# Patient Record
Sex: Female | Born: 1960 | Race: Black or African American | Hispanic: No | Marital: Single | Smoking: Former smoker
Health system: Southern US, Community
[De-identification: ages and names within clinical notes are randomized; demographics above are authoritative.]

## PROBLEM LIST (undated history)

## (undated) DIAGNOSIS — K219 Gastro-esophageal reflux disease without esophagitis: Secondary | ICD-10-CM

## (undated) DIAGNOSIS — N390 Urinary tract infection, site not specified: Secondary | ICD-10-CM

## (undated) DIAGNOSIS — I10 Essential (primary) hypertension: Secondary | ICD-10-CM

## (undated) DIAGNOSIS — T7840XA Allergy, unspecified, initial encounter: Secondary | ICD-10-CM

## (undated) DIAGNOSIS — G43909 Migraine, unspecified, not intractable, without status migrainosus: Secondary | ICD-10-CM

## (undated) DIAGNOSIS — IMO0002 Reserved for concepts with insufficient information to code with codable children: Secondary | ICD-10-CM

## (undated) DIAGNOSIS — R011 Cardiac murmur, unspecified: Secondary | ICD-10-CM

## (undated) DIAGNOSIS — Z5189 Encounter for other specified aftercare: Secondary | ICD-10-CM

## (undated) HISTORY — DX: Essential (primary) hypertension: I10

## (undated) HISTORY — DX: Encounter for other specified aftercare: Z51.89

## (undated) HISTORY — DX: Gastro-esophageal reflux disease without esophagitis: K21.9

## (undated) HISTORY — DX: Cardiac murmur, unspecified: R01.1

## (undated) HISTORY — DX: Migraine, unspecified, not intractable, without status migrainosus: G43.909

## (undated) HISTORY — PX: TUBAL LIGATION: SHX77

## (undated) HISTORY — DX: Urinary tract infection, site not specified: N39.0

## (undated) HISTORY — DX: Allergy, unspecified, initial encounter: T78.40XA

## (undated) HISTORY — DX: Reserved for concepts with insufficient information to code with codable children: IMO0002

---

## 1999-05-21 HISTORY — PX: SHOULDER ARTHROSCOPY: SHX128

## 2010-05-20 HISTORY — PX: UTERINE FIBROID SURGERY: SHX826

## 2015-12-06 DIAGNOSIS — J3089 Other allergic rhinitis: Secondary | ICD-10-CM | POA: Diagnosis not present

## 2015-12-06 DIAGNOSIS — M545 Low back pain: Secondary | ICD-10-CM | POA: Diagnosis not present

## 2016-06-26 DIAGNOSIS — J00 Acute nasopharyngitis [common cold]: Secondary | ICD-10-CM | POA: Diagnosis not present

## 2016-06-27 ENCOUNTER — Encounter: Payer: Self-pay | Admitting: Family Medicine

## 2016-06-27 ENCOUNTER — Ambulatory Visit (INDEPENDENT_AMBULATORY_CARE_PROVIDER_SITE_OTHER): Payer: BLUE CROSS/BLUE SHIELD | Admitting: Family Medicine

## 2016-06-27 VITALS — BP 146/94 | HR 70

## 2016-06-27 DIAGNOSIS — J324 Chronic pansinusitis: Secondary | ICD-10-CM | POA: Diagnosis not present

## 2016-06-27 DIAGNOSIS — Z7689 Persons encountering health services in other specified circumstances: Secondary | ICD-10-CM

## 2016-06-27 DIAGNOSIS — R030 Elevated blood-pressure reading, without diagnosis of hypertension: Secondary | ICD-10-CM | POA: Diagnosis not present

## 2016-06-27 DIAGNOSIS — Z91018 Allergy to other foods: Secondary | ICD-10-CM | POA: Insufficient documentation

## 2016-06-27 MED ORDER — DOXYCYCLINE HYCLATE 100 MG PO TABS: 100.0000 mg | ORAL_TABLET | Freq: Two times a day (BID) | ORAL | 0 refills | Status: DC

## 2016-06-27 NOTE — Progress Notes (Signed)
Patient ID: Tracey Terrell, female   DOB: 10-Mar-1961, 56 y.o.   MRN: [id number]  Patient presents to clinic today to establish care.  Acute Concerns:  Symptoms of sinus pressure/pain, congestion that started one week ago.  Associated rhinitis with yellow drainage, productive cough with yellow sputum, vomiting x 2 which has improved, ear pain, nasal congestion, and chills, sweats. She reports congestion and sinus pressure/pain have been present for 6 to 8 weeks and have improved then worsened and are present again for the past week. Other symptoms have improved. She denies sore throat, fever, SOB, and tooth pain. No history of asthma/bronchitis. She is a former smoker; quit 10 years ago. No recent sick contact exposure. No recent antibiotic therapy. Treatment at home includes pseudophedrine and benedryl have provided limited benefit. History of allergic rhinitis.   Health Maintenance: Dental --Every 6 months Vision --Every year Immunizations --No influenza vaccine.  Td in 2014 Colonoscopy -- 2014; Return in 10 years. Mammogram -- Due for one. Due in 2017 PAP -- Last pap done 2017; yearly follow up; Pap normal per patient Bone Density -- Screening; 2017    Allergies  Allergen Reactions  . Naproxen Anaphylaxis    No family history on file.  Social History   Social History  . Marital status: Single    Spouse name: N/A  . Number of children: N/A  . Years of education: N/A   Occupational History  . Not on file.   Social History Main Topics  . Smoking status: Not on file  . Smokeless tobacco: Not on file  . Alcohol use Not on file  . Drug use: Unknown  . Sexual activity: Not on file   Other Topics Concern  . Not on file   Social History Narrative  . No narrative on file    Review of Systems  Constitutional: Positive for chills.  HENT: Positive for congestion, ear pain and sinus pain. Negative for sore throat.   Respiratory: Positive for cough and wheezing.  Negative for sputum production.   Cardiovascular: Negative for chest pain.  Gastrointestinal: Positive for nausea and vomiting.  Musculoskeletal: Negative for myalgias.  Neurological: Negative for dizziness and headaches.   Past Medical History:  Diagnosis Date  . Allergy   . Blood transfusion without reported diagnosis   . GERD (gastroesophageal reflux disease)   . Heart murmur   . Migraines   . Ulcer (HCC)   . UTI (urinary tract infection)      Social History   Social History  . Marital status: Single    Spouse name: N/A  . Number of children: N/A  . Years of education: N/A   Occupational History  . Not on file.   Social History Main Topics  . Smoking status: Never Smoker  . Smokeless tobacco: Never Used  . Alcohol use 0.6 oz/week    1 Glasses of wine per week  . Drug use: No  . Sexual activity: Yes     Comment: TUBAL  LIGATION   Other Topics Concern  . Not on file   Social History Narrative  . No narrative on file    Past Surgical History:  Procedure Laterality Date  . CESAREAN SECTION      Family History  Problem Relation Age of Onset  . Arthritis Mother   . Cancer Mother   . Heart disease Mother   . Hypertension Mother   . Diabetes Mother   . Arthritis Father   . Cancer Father   .  Heart disease Father   . Hypertension Father   . Diabetes Father   . Hypertension Sister   . Hypertension Brother   . Diabetes Maternal Grandmother   . Hypertension Maternal Grandfather   . Arthritis Maternal Grandfather     Allergies  Allergen Reactions  . Naproxen Anaphylaxis    No current outpatient prescriptions on file prior to visit.   No current facility-administered medications on file prior to visit.     BP (!) 146/94   Pulse 70       Physical Exam  Constitutional: She is oriented to person, place, and time and well-developed, well-nourished, and in no distress.  HENT:  Right Ear: Tympanic membrane normal.  Left Ear: Tympanic membrane  normal.  Nose: Rhinorrhea present. Right sinus exhibits maxillary sinus tenderness and frontal sinus tenderness. Left sinus exhibits maxillary sinus tenderness and frontal sinus tenderness.  Mouth/Throat: Mucous membranes are normal. No oropharyngeal exudate or posterior oropharyngeal erythema.  Eyes: Pupils are equal, round, and reactive to light. No scleral icterus.  Neck: Neck supple.  Cardiovascular: Normal rate and regular rhythm.   Pulmonary/Chest: Effort normal and breath sounds normal. She has no wheezes.  Abdominal: Soft. Bowel sounds are normal. There is no tenderness.  Neurological: She is alert and oriented to person, place, and time.  Skin: Skin is warm and dry. No rash noted.  Psychiatric: Mood, memory, affect and judgment normal.    Assessment/Plan:  1. Elevated blood pressure reading without diagnosis of hypertension Retake of 146/94. No history of HTN per patient. Suspect that BP was elevated due to pseudoephedrine use on a chronic basis. Advised stopping pseudoephedrine and use either Allegra, claritin, or Zyrtec for symptoms of post nasal drip and allergic rhinitis. Advised monitoring BP for 2 weeks, document readings, and follow up for further evaluation. BP recommendations provided.  2. Pansinusitis, unspecified chronicity Symptom duration of greater than one week; double sickening presentation; will treat with doxycycline; Advised patient on supportive measures: Suspect nausea and prior episodes of vomiting that have resolved were viral in nature. Persistent symptoms of sinus pressure/pain remain. Get rest, drink plenty of fluids, and use tylenol as needed for pain. Follow up if fever >101, if symptoms worsen or if symptoms are not improved in 3 to 4 days. Patient verbalizes understanding.    - doxycycline (VIBRA-TABS) 100 MG tablet; Take 1 tablet (100 mg total) by mouth 2 (two) times daily.  Dispense: 20 tablet; Refill: 0  3. Encounter to establish care We reviewed the  PMH, PSH, FH, SH, Meds and Allergies. -We provided refills for any medications we will prescribe as needed. -We addressed current concerns per orders and patient instructions. -We have asked for records for pertinent exams, studies, vaccines and notes from previous providers. -We have advised patient to follow up per instructions below.   -Patient advised to return or notify a provider immediately if symptoms worsen or persist or new concerns arise.  Advised follow up in 2 weeks for BP evaluation, physical, and lab work.  Roddie Mc, FNP-C

## 2016-06-27 NOTE — Patient Instructions (Addendum)
It was pleasure meeting you today! Please take antibiotic as directed and follow up if symptoms do not improve in 3 to 4 days, worsen, or you develop a fever >100.  Please discontinue use of pseudoephedrine and use either Allegra, Claritin, or Zyrtec as needed for symptoms.  Minimal Blood Pressure Goal= AVERAGE < 140/90; Ideal is an AVERAGE < 135/85. This AVERAGE should be calculated from @ least 5-7 BP readings taken @ different times of day on different days of week. You should not respond to isolated BP readings , but rather the AVERAGE for that week .Please bring your blood pressure cuff to office visits to verify that it is reliable.It can also be checked against the blood pressure device at the pharmacy. Finger or wrist cuffs are not dependable; an arm cuff is.  Follow up in 2 weeks for further evaluation of your BP and schedule a physical and lab work at your convenience.    Sinus Headache A sinus headache occurs when the paranasal sinuses become clogged or swollen. Paranasal sinuses are air pockets within the bones of the face. Sinus headaches can range from mild to severe. What are the causes? A sinus headache can result from various conditions that affect the sinuses, such as:  Colds.  Sinus infections.  Allergies. What are the signs or symptoms? The main symptom of this condition is a headache that may feel like pain or pressure in the face, forehead, ears, or upper teeth. People who have a sinus headache often have other symptoms, such as:  Congested or runny nose.  Fever.  Inability to smell. Weather changes can make symptoms worse. How is this diagnosed? This condition may be diagnosed based on:  A physical exam and medical history.  Imaging tests, such as a CT scan and MRI, to check for problems with the sinuses.  A specialist may look into the sinuses with a tool that has a camera (endoscopy). How is this treated? Treatment for this condition depends on the  cause.  Sinus pain that is caused by a sinus infection may be treated with antibiotic medicine.  Sinus pain that is caused by allergies may be helped by allergy medicines (antihistamines) and medicated nasal sprays.  Sinus pain that is caused by congestion may be helped by flushing the nose and sinuses with saline solution. Follow these instructions at home:  Take medicines only as directed by your health care provider.  If you were prescribed an antibiotic medicine, finish all of it even if you start to feel better.  If you have congestion, use a nasal spray to help reduce pressure.  If directed, apply a warm, moist washcloth to your face to help relieve pain. Contact a health care provider if:  You have headaches more than one time each week.  You have sensitivity to light or sound.  You have a fever.  You feel sick to your stomach (nauseous) or you throw up (vomit).  Your headaches do not get better with treatment. Many people think that they have a sinus headache when they actually have migraines or tension headaches. Get help right away if:  You have vision problems.  You have sudden, severe pain in your face or head.  You have a seizure.  You are confused.  You have a stiff neck. This information is not intended to replace advice given to you by your health care provider. Make sure you discuss any questions you have with your health care provider. Document Released: 06/13/2004 Document Revised:  12/31/2015 Document Reviewed: 05/02/2014 Elsevier Interactive Patient Education  2017 [employer].

## 2016-06-27 NOTE — Progress Notes (Signed)
Pre visit review using our clinic review tool, if applicable. No additional management support is needed unless otherwise documented below in the visit note. 

## 2016-06-28 ENCOUNTER — Telehealth: Payer: Self-pay | Admitting: Family Medicine

## 2016-06-28 ENCOUNTER — Other Ambulatory Visit: Payer: Self-pay

## 2016-06-28 DIAGNOSIS — J324 Chronic pansinusitis: Secondary | ICD-10-CM

## 2016-06-28 MED ORDER — DOXYCYCLINE HYCLATE 100 MG PO TABS: 100.0000 mg | ORAL_TABLET | Freq: Two times a day (BID) | ORAL | 0 refills | Status: AC

## 2016-06-28 NOTE — Telephone Encounter (Signed)
Pt calling stating that the Rx was not at the pharmacy can it be resent to [facility] [address].

## 2016-06-28 NOTE — Telephone Encounter (Signed)
Rx was resend to [facility], patient aware.

## 2016-07-11 ENCOUNTER — Encounter: Payer: Self-pay | Admitting: Family Medicine

## 2016-07-11 ENCOUNTER — Ambulatory Visit (INDEPENDENT_AMBULATORY_CARE_PROVIDER_SITE_OTHER): Payer: BLUE CROSS/BLUE SHIELD | Admitting: Family Medicine

## 2016-07-11 VITALS — BP 148/98 | HR 72 | Temp 98.2°F | Wt 156.0 lb

## 2016-07-11 DIAGNOSIS — I10 Essential (primary) hypertension: Secondary | ICD-10-CM | POA: Diagnosis not present

## 2016-07-11 LAB — BASIC METABOLIC PANEL
BUN: 13 mg/dL (ref 6–23)
CHLORIDE: 106 meq/L (ref 96–112)
CO2: 31 mEq/L (ref 19–32)
CREATININE: 0.78 mg/dL (ref 0.40–1.20)
Calcium: 9.1 mg/dL (ref 8.4–10.5)
GFR: 98.72 mL/min (ref >60.00)
GLUCOSE: 91 mg/dL (ref 70–99)
Potassium: 4 mEq/L (ref 3.5–5.1)
Sodium: 141 mEq/L (ref 135–145)

## 2016-07-11 LAB — HEPATIC FUNCTION PANEL
ALBUMIN: 4.2 g/dL (ref 3.5–5.2)
ALT: 13 U/L (ref 0–35)
AST: 17 U/L (ref 0–37)
Alkaline Phosphatase: 91 U/L (ref 39–117)
BILIRUBIN TOTAL: 0.3 mg/dL (ref 0.2–1.2)
Bilirubin, Direct: 0.1 mg/dL (ref 0.0–0.3)
TOTAL PROTEIN: 6.8 g/dL (ref 6.0–8.3)

## 2016-07-11 LAB — CBC WITH DIFFERENTIAL/PLATELET
Basophils Absolute: 0 10*3/uL (ref 0.0–0.1)
Basophils Relative: 0.8 % (ref 0.0–3.0)
EOS ABS: 0.1 10*3/uL (ref 0.0–0.7)
EOS PCT: 2.3 % (ref 0.0–5.0)
HEMATOCRIT: 36.6 % (ref 36.0–46.0)
Hemoglobin: 12 g/dL (ref 12.0–15.0)
Lymphocytes Relative: 40.7 % (ref 12.0–46.0)
Lymphs Abs: 1.5 10*3/uL (ref 0.7–4.0)
MCHC: 32.9 g/dL (ref 30.0–36.0)
MCV: 87.2 fl (ref 78.0–100.0)
MONO ABS: 0.3 10*3/uL (ref 0.1–1.0)
Monocytes Relative: 6.9 % (ref 3.0–12.0)
Neutro Abs: 1.9 10*3/uL (ref 1.4–7.7)
Neutrophils Relative %: 49.3 % (ref 43.0–77.0)
Platelets: 227 10*3/uL (ref 150.0–400.0)
RBC: 4.2 Mil/uL (ref 3.87–5.11)
RDW: 13.9 % (ref 11.5–15.5)
WBC: 3.8 10*3/uL — AB (ref 4.0–10.5)

## 2016-07-11 MED ORDER — AMLODIPINE BESYLATE 5 MG PO TABS: 5.0000 mg | ORAL_TABLET | Freq: Every day | ORAL | 1 refills | Status: DC

## 2016-07-11 NOTE — Patient Instructions (Signed)
Please take medication as directed and continue monitoring blood pressure. Please bring readings with you to your next visit with your cuff so we can review blood pressure averages and effectiveness of medication. Follow up in 2 weeks for blood pressure evaluation or sooner if needed. Also, please feel free to call with any questions or concerns that you have as you start this new medication. Great work with dietary changes! Please continue to monitor your salt intake. See recommendations below.  DASH Eating Plan DASH stands for "Dietary Approaches to Stop Hypertension." The DASH eating plan is a healthy eating plan that has been shown to reduce high blood pressure (hypertension). Additional health benefits may include reducing the risk of type 2 diabetes mellitus, heart disease, and stroke. The DASH eating plan may also help with weight loss. What do I need to know about the DASH eating plan? For the DASH eating plan, you will follow these general guidelines:  Choose foods with less than 150 milligrams of sodium per serving (as listed on the food label).  Use salt-free seasonings or herbs instead of table salt or sea salt.  Check with your health care provider or pharmacist before using salt substitutes.  Eat lower-sodium products. These are often labeled as "low-sodium" or "no salt added."  Eat fresh foods. Avoid eating a lot of canned foods.  Eat more vegetables, fruits, and low-fat dairy products.  Choose whole grains. Look for the word "whole" as the first word in the ingredient list.  Choose fish and skinless chicken or [place] more often than red meat. Limit fish, poultry, and meat to 6 oz (170 g) each day.  Limit sweets, desserts, sugars, and sugary drinks.  Choose heart-healthy fats.  Eat more home-cooked food and less restaurant, buffet, and fast food.  Limit fried foods.  Do not fry foods. Cook foods using methods such as baking, boiling, grilling, and broiling  instead.  When eating at a restaurant, ask that your food be prepared with less salt, or no salt if possible. What foods can I eat? Seek help from a dietitian for individual calorie needs. Grains  Whole grain or whole wheat bread. Brown rice. Whole grain or whole wheat pasta. Quinoa, bulgur, and whole grain cereals. Low-sodium cereals. Corn or whole wheat flour tortillas. Whole grain cornbread. Whole grain crackers. Low-sodium crackers. Vegetables  Fresh or frozen vegetables (raw, steamed, roasted, or grilled). Low-sodium or reduced-sodium tomato and vegetable juices. Low-sodium or reduced-sodium tomato sauce and paste. Low-sodium or reduced-sodium canned vegetables. Fruits  All fresh, canned (in natural juice), or frozen fruits. Meat and Other Protein Products  Ground beef (85% or leaner), grass-fed beef, or beef trimmed of fat. Skinless chicken or [place]. Ground chicken or [place]key. Pork trimmed of fat. All fish and seafood. Eggs. Dried beans, peas, or lentils. Unsalted nuts and seeds. Unsalted canned beans. Dairy  Low-fat dairy products, such as skim or 1% milk, 2% or reduced-fat cheeses, low-fat ricotta or cottage cheese, or plain low-fat yogurt. Low-sodium or reduced-sodium cheeses. Fats and Oils  Tub margarines without trans fats. Light or reduced-fat mayonnaise and salad dressings (reduced sodium). Avocado. Safflower, olive, or canola oils. Natural peanut or almond butter. Other  Unsalted popcorn and pretzels. The items listed above may not be a complete list of recommended foods or beverages. Contact your dietitian for more options.  What foods are not recommended? Grains  White bread. White pasta. White rice. Refined cornbread. Bagels and croissants. Crackers that contain trans fat. Vegetables  Creamed or fried  vegetables. Vegetables in a cheese sauce. Regular canned vegetables. Regular canned tomato sauce and paste. Regular tomato and vegetable juices. Fruits  Canned fruit in light  or heavy syrup. Fruit juice. Meat and Other Protein Products  Fatty cuts of meat. Ribs, chicken wings, bacon, sausage, bologna, salami, chitterlings, fatback, hot dogs, bratwurst, and packaged luncheon meats. Salted nuts and seeds. Canned beans with salt. Dairy  Whole or 2% milk, cream, half-and-half, and cream cheese. Whole-fat or sweetened yogurt. Full-fat cheeses or blue cheese. Nondairy creamers and whipped toppings. Processed cheese, cheese spreads, or cheese curds. Condiments  Onion and garlic salt, seasoned salt, table salt, and sea salt. Canned and packaged gravies. Worcestershire sauce. Tartar sauce. Barbecue sauce. Teriyaki sauce. Soy sauce, including reduced sodium. Steak sauce. Fish sauce. Oyster sauce. Cocktail sauce. Horseradish. Ketchup and mustard. Meat flavorings and tenderizers. Bouillon cubes. Hot sauce. Tabasco sauce. Marinades. Taco seasonings. Relishes. Fats and Oils  Butter, stick margarine, lard, shortening, ghee, and bacon fat. Coconut, palm kernel, or palm oils. Regular salad dressings. Other  Pickles and olives. Salted popcorn and pretzels. The items listed above may not be a complete list of foods and beverages to avoid. Contact your dietitian for more information.  Where can I find more information? National Heart, Lung, and Blood Institute: [employer] This information is not intended to replace advice given to you by your health care provider. Make sure you discuss any questions you have with your health care provider. Document Released: 04/25/2011 Document Revised: 10/12/2015 Document Reviewed: 03/10/2013 Elsevier Interactive Patient Education  2017 [employer].

## 2016-07-11 NOTE — Progress Notes (Signed)
Subjective:    Patient ID: Tracey Terrell, female    DOB: 06-18-1960, 56 y.o.   MRN: [id number]  HPI  Tracey Terrell is a 56 year old female who presents today for follow up for evaluation of her blood pressure after an elevated reading noted on 06/27/16. At the 06/27/16 visit she was experiencing congestion and was taking pseudoephedrine which may have led to an increase in her BP.  She is monitoring her BP at least 6 days/week and usually in the morning.  She reports average BP is noted as systolic in the 140s and the 90s diastolic. She denies chest pain, palpitations, SOB, edema, headaches, numbness, tingling, or weakness, nosebleeds, and edema. She reports decreasing her salt intake and increasing her her lean meats and vegetables for 2 to 3 days as she has experienced weight gain due to eating fast food. She does not exercise but reports that she plans to start walking due to weather improving.  No prior treatment for HTN. This is her second visit to the practice as she is new, established care and has been advised to follow up for full physical and lab work.    Review of Systems  Constitutional: Negative for chills, fatigue and fever.  HENT: Positive for congestion. Negative for rhinorrhea, sinus pain, sinus pressure and sneezing.   Eyes: Negative for visual disturbance.  Respiratory: Negative for cough, shortness of breath and wheezing.   Cardiovascular: Negative for chest pain, palpitations and leg swelling.  Gastrointestinal: Negative for abdominal pain, diarrhea, nausea and vomiting.  Musculoskeletal: Negative for myalgias.  Neurological: Negative for dizziness, weakness, light-headedness, numbness and headaches.  Psychiatric/Behavioral:       Denies depressed or anxious mood   Past Medical History:  Diagnosis Date  . Allergy   . Blood transfusion without reported diagnosis   . GERD (gastroesophageal reflux disease)   . Heart murmur   . Migraines   . Ulcer (HCC)   . UTI  (urinary tract infection)      Social History   Social History  . Marital status: Single    Spouse name: N/A  . Number of children: N/A  . Years of education: N/A   Occupational History  . Not on file.   Social History Main Topics  . Smoking status: Never Smoker  . Smokeless tobacco: Never Used  . Alcohol use 0.6 oz/week    1 Glasses of wine per week  . Drug use: No  . Sexual activity: Yes     Comment: TUBAL  LIGATION   Other Topics Concern  . Not on file   Social History Narrative  . No narrative on file    Past Surgical History:  Procedure Laterality Date  . CESAREAN SECTION      Family History  Problem Relation Age of Onset  . Arthritis Mother   . Cancer Mother   . Heart disease Mother   . Hypertension Mother   . Diabetes Mother   . Arthritis Father   . Cancer Father   . Heart disease Father   . Hypertension Father   . Diabetes Father   . Hypertension Sister   . Hypertension Brother   . Diabetes Maternal Grandmother   . Hypertension Maternal Grandfather   . Arthritis Maternal Grandfather     Allergies  Allergen Reactions  . Naproxen Anaphylaxis    Current Outpatient Prescriptions on File Prior to Visit  Medication Sig Dispense Refill  . ibuprofen (ADVIL,MOTRIN) 200 MG tablet Take 200 mg by  mouth every 6 (six) hours as needed for mild pain (S NEEDED FOR MILD PAIN).      No current facility-administered medications on file prior to visit.     BP (!) 148/98   Pulse 72   Temp 98.2 F (36.8 C) (Oral)   Wt 156 lb (70.8 kg)   SpO2 98%       Objective:   Physical Exam  Constitutional: She is oriented to person, place, and time. She appears well-developed and well-nourished.  HENT:  Nose: No rhinorrhea. Right sinus exhibits no maxillary sinus tenderness and no frontal sinus tenderness. Left sinus exhibits no maxillary sinus tenderness and no frontal sinus tenderness.  Mouth/Throat: Mucous membranes are normal. No oropharyngeal exudate or  posterior oropharyngeal erythema.  Eyes: Pupils are equal, round, and reactive to light. No scleral icterus.  Neck: Neck supple.  Cardiovascular: Normal rate, regular rhythm and intact distal pulses.   Pulmonary/Chest: Effort normal and breath sounds normal. She has no wheezes. She has no rales.  Abdominal: Soft. Bowel sounds are normal. There is no tenderness.  Lymphadenopathy:    She has no cervical adenopathy.  Neurological: She is alert and oriented to person, place, and time.  Skin: Skin is warm and dry. No rash noted.  Psychiatric: She has a normal mood and affect. Her behavior is normal. Judgment and thought content normal.       Assessment & Plan:  1. Essential hypertension BP is elevated even after stopping nasal decongestant. Home readings and in office readings indicate elevation. She does not have any lab work on record. Will obtain basic labs today and initiate treatment with amlodipine with recommendations for continued BP monitoring and follow up in 2 weeks or sooner if needed. Recommend scheduling a physical with lab work within the next 2 months. - CBC with Differential/Platelet - Basic metabolic panel - Hepatic function panel - amLODipine (NORVASC) 5 MG tablet; Take 1 tablet (5 mg total) by mouth daily.  Dispense: 30 tablet; Refill: 1  Roddie McJulia Mickie Badders, FNP-C

## 2016-07-11 NOTE — Progress Notes (Signed)
Pre visit review using our clinic review tool, if applicable. No additional management support is needed unless otherwise documented below in the visit note. 

## 2016-07-15 DIAGNOSIS — Z01419 Encounter for gynecological examination (general) (routine) without abnormal findings: Secondary | ICD-10-CM | POA: Diagnosis not present

## 2016-07-15 DIAGNOSIS — Z6827 Body mass index (BMI) 27.0-27.9, adult: Secondary | ICD-10-CM | POA: Diagnosis not present

## 2016-11-02 DIAGNOSIS — J029 Acute pharyngitis, unspecified: Secondary | ICD-10-CM | POA: Diagnosis not present

## 2016-11-02 DIAGNOSIS — J019 Acute sinusitis, unspecified: Secondary | ICD-10-CM | POA: Diagnosis not present

## 2016-11-07 ENCOUNTER — Ambulatory Visit (INDEPENDENT_AMBULATORY_CARE_PROVIDER_SITE_OTHER): Payer: BLUE CROSS/BLUE SHIELD | Admitting: Family Medicine

## 2016-11-07 ENCOUNTER — Encounter: Payer: Self-pay | Admitting: Family Medicine

## 2016-11-07 VITALS — BP 138/92 | HR 86 | Temp 99.1°F | Wt 154.0 lb

## 2016-11-07 DIAGNOSIS — J0140 Acute pansinusitis, unspecified: Secondary | ICD-10-CM

## 2016-11-07 DIAGNOSIS — I10 Essential (primary) hypertension: Secondary | ICD-10-CM | POA: Insufficient documentation

## 2016-11-07 MED ORDER — AMOXICILLIN-POT CLAVULANATE 875-125 MG PO TABS: 1.0000 | ORAL_TABLET | Freq: Two times a day (BID) | ORAL | 0 refills | Status: DC

## 2016-11-07 NOTE — Progress Notes (Signed)
Patient ID: Tracey Terrell, female   DOB: 06/13/1960, 56 y.o.   MRN: [id number]  PCP: Roddie McKordsmeier, Genene Kilman, FNP  Subjective:  Tracey Terrell is a 56 y.o. year old very pleasant female patient who presents with Upper Respiratory infection symptoms including  nasal congestion, sore throat, sinus pressure/pain, chills, and low grade fever. -started: 9 days ago, symptoms are not improving -previous treatments: Z-pack, zyrtec, and advil has provided limited benefit. -sick contacts/travel/risks: denies flu exposure. No recent sick contact exposure -Hx of: allergies  ROS-denies, SOB, NVD, tooth pain  Pertinent Past Medical History- HTN  Medications- reviewed  Current Outpatient Prescriptions  Medication Sig Dispense Refill  . amLODipine (NORVASC) 5 MG tablet Take 1 tablet (5 mg total) by mouth daily. 30 tablet 1  . ibuprofen (ADVIL,MOTRIN) 200 MG tablet Take 200 mg by mouth every 6 (six) hours as needed for mild pain (S NEEDED FOR MILD PAIN).      No current facility-administered medications for this visit.     Objective: BP (!) 138/92 (BP Location: Left Arm, Patient Position: Sitting, Cuff Size: Normal)   Pulse 86   Temp 99.1 F (37.3 C) (Oral)   Wt 154 lb (69.9 kg)   SpO2 98%  Gen: NAD, resting comfortably HEENT: Turbinates erythematous, TM normal, pharynx mildly erythematous with no tonsilar exudate or edema, + sinus tenderness CV: RRR no murmurs rubs or gallops Lungs: CTAB no crackles, wheeze, rhonchi Abdomen: soft/nontender/nondistended/normal bowel sounds. No rebound or guarding.  Ext: no edema Skin: warm, dry, no rash Neuro: grossly normal, moves all extremities  Assessment/Plan:  1. Acute pansinusitis, recurrence not specified Exam and history support treatment for sinusitis. Symptoms are not improving at this time. We discussed that this can be viral in natureand she was advised to treat symptoms for 3 to 4 more days as Z-pack is still in her system and if she is not feeling  better, she can fill the Rx for Augmentin. Advised her to take this with food and add yogurt or probiotic to her diet as she has recently had a Z-pack. Further advised her to alternate advil and acetaminophen for symptom management. She has a history of naproxen allergy however has had no other problem with advil.  Follow up if symptoms do not improve with treatment, worsen, or she develop fever >101.  Roddie McJulia Tarell Schollmeyer, FNP-C    Inez CatalinaJulia Ann Cameran Ahmed, FNP

## 2016-11-07 NOTE — Patient Instructions (Signed)
Please treat symptoms as discussed. Please drink plenty of water enough for your urine to be pale yellow or clear. You may use Tylenol 325 mg every 6 hours as needed. Mucinex DM can be used for cough. Follow-up for evaluation if your symptoms do not improve in 3-4 days, worsen, or you develop a fever greater than 101.  Feel better soon!   Sinusitis, Adult Sinusitis is soreness and inflammation of your sinuses. Sinuses are hollow spaces in the bones around your face. They are located:  Around your eyes.  In the middle of your forehead.  Behind your nose.  In your cheekbones.  Your sinuses and nasal passages are lined with a stringy fluid (mucus). Mucus normally drains out of your sinuses. When your nasal tissues get inflamed or swollen, the mucus can get trapped or blocked so air cannot flow through your sinuses. This lets bacteria, viruses, and funguses grow, and that leads to infection. Follow these instructions at home: Medicines  Take, use, or apply over-the-counter and prescription medicines only as told by your doctor. These may include nasal sprays.  If you were prescribed an antibiotic medicine, take it as told by your doctor. Do not stop taking the antibiotic even if you start to feel better. Hydrate and Humidify  Drink enough water to keep your pee (urine) clear or pale yellow.  Use a cool mist humidifier to keep the humidity level in your home above 50%.  Breathe in steam for 10-15 minutes, 3-4 times a day or as told by your doctor. You can do this in the bathroom while a hot shower is running.  Try not to spend time in cool or dry air. Rest  Rest as much as possible.  Sleep with your head raised (elevated).  Make sure to get enough sleep each night. General instructions  Put a warm, moist washcloth on your face 3-4 times a day or as told by your doctor. This will help with discomfort.  Wash your hands often with soap and water. If there is no soap and water, use  hand sanitizer.  Do not smoke. Avoid being around people who are smoking (secondhand smoke).  Keep all follow-up visits as told by your doctor. This is important. Contact a doctor if:  You have a fever.  Your symptoms get worse.  Your symptoms do not get better within 10 days. Get help right away if:  You have a very bad headache.  You cannot stop throwing up (vomiting).  You have pain or swelling around your face or eyes.  You have trouble seeing.  You feel confused.  Your neck is stiff.  You have trouble breathing. This information is not intended to replace advice given to you by your health care provider. Make sure you discuss any questions you have with your health care provider. Document Released: 10/23/2007 Document Revised: 12/31/2015 Document Reviewed: 03/01/2015 Elsevier Interactive Patient Education  [employer]2018 Elsevier Inc.

## 2016-11-15 DIAGNOSIS — H40013 Open angle with borderline findings, low risk, bilateral: Secondary | ICD-10-CM | POA: Diagnosis not present

## 2016-12-24 ENCOUNTER — Ambulatory Visit (INDEPENDENT_AMBULATORY_CARE_PROVIDER_SITE_OTHER): Payer: BLUE CROSS/BLUE SHIELD | Admitting: Family Medicine

## 2016-12-24 ENCOUNTER — Encounter: Payer: Self-pay | Admitting: Family Medicine

## 2016-12-24 VITALS — BP 156/101 | HR 56 | Temp 98.6°F | Wt 150.0 lb

## 2016-12-24 DIAGNOSIS — R55 Syncope and collapse: Secondary | ICD-10-CM

## 2016-12-24 DIAGNOSIS — R42 Dizziness and giddiness: Secondary | ICD-10-CM | POA: Diagnosis not present

## 2016-12-24 DIAGNOSIS — I10 Essential (primary) hypertension: Secondary | ICD-10-CM

## 2016-12-24 MED ORDER — CETIRIZINE HCL 10 MG PO TABS: 10.0000 mg | ORAL_TABLET | Freq: Every day | ORAL | 11 refills | Status: DC

## 2016-12-24 MED ORDER — AMLODIPINE BESYLATE 5 MG PO TABS: 5.0000 mg | ORAL_TABLET | Freq: Every day | ORAL | 0 refills | Status: DC

## 2016-12-24 MED ORDER — HYDROCHLOROTHIAZIDE 25 MG PO TABS: 25.0000 mg | ORAL_TABLET | Freq: Every day | ORAL | 0 refills | Status: DC

## 2016-12-24 NOTE — Patient Instructions (Signed)
WE NOW OFFER   [facility] FAST TRACK!!!  SAME DAY Appointments for ACUTE CARE  Such as: Sprains, Injuries, cuts, abrasions, rashes, muscle pain, joint pain, back pain Colds, flu, sore throats, headache, allergies, cough, fever  Ear pain, sinus and eye infections Abdominal pain, nausea, vomiting, diarrhea, upset stomach Animal/insect bites  3 Easy Ways to Schedule: Walk-In Scheduling Call in scheduling Mychart Sign-up: [email or web address]         

## 2016-12-24 NOTE — Progress Notes (Signed)
   Subjective:    Patient ID: Tracey ComfortBarbara Terrell, female    DOB: [birth year], 56 y.o.   MRN: [id number]  HPI Here for an episode of dizziness and passing out last night. She normally takes Zyrtec every day for her allergies but she ran out 3 days ago. She notes her BP has been running a little high for several months. Her systolic runs in the 130s and her diastoic runs in the 90s. Last night she awoke from sleep and got up to use the bathroom. She suddenly felt dizzy and the room started spinning around her. She apparently passed out and fell to the floor. She came to very quickly after only a few seconds. She thinks she twisted her neck slightly because she is a little stiff and sore today. She got and went back to bed. This morning she feels back to normal with no dizziness at all. No chest pain or SOB or headache.    Review of Systems  Constitutional: Negative.   HENT: Positive for congestion, postnasal drip and sinus pressure.   Respiratory: Negative.   Cardiovascular: Negative.   Neurological: Positive for dizziness and syncope. Negative for tremors, seizures, facial asymmetry, speech difficulty, weakness, light-headedness, numbness and headaches.       Objective:   Physical Exam  Constitutional: She is oriented to person, place, and time. She appears well-developed and well-nourished. No distress.  HENT:  Head: Normocephalic and atraumatic.  Right Ear: External ear normal.  Left Ear: External ear normal.  Nose: Nose normal.  Mouth/Throat: Oropharynx is clear and moist.  Eyes: Pupils are equal, round, and reactive to light. Conjunctivae and EOM are normal.  Neck: Neck supple. No thyromegaly present.  Cardiovascular: Normal rate, regular rhythm, normal heart sounds and intact distal pulses.   Pulmonary/Chest: Effort normal and breath sounds normal. No respiratory distress. She has no wheezes. She has no rales.  Lymphadenopathy:    She has no cervical adenopathy.  Neurological: She is  alert and oriented to person, place, and time. No cranial nerve deficit. She exhibits normal muscle tone. Coordination normal.          Assessment & Plan:  She had an episode of syncope last night and it sounds like this was the result of some vertigo. This may have been triggered by some sinus congestion. She is now back on er daily Zyrtec. I advised her to rest today and drink plenty of fluids. Her HTN has not been well controlled so we will add HCTZ 25 mg daily to her regimen. Follow up with Raynelle FanningJulie in 2-3 weeks.  Gershon CraneStephen Ozil Stettler, MD

## 2017-01-08 ENCOUNTER — Ambulatory Visit (INDEPENDENT_AMBULATORY_CARE_PROVIDER_SITE_OTHER): Payer: BLUE CROSS/BLUE SHIELD | Admitting: Family Medicine

## 2017-01-08 ENCOUNTER — Encounter: Payer: Self-pay | Admitting: Family Medicine

## 2017-01-08 VITALS — BP 104/82 | HR 70 | Temp 98.5°F | Wt 154.0 lb

## 2017-01-08 DIAGNOSIS — M545 Low back pain, unspecified: Secondary | ICD-10-CM

## 2017-01-08 MED ORDER — PREDNISONE 10 MG PO TABS: ORAL_TABLET | ORAL | 0 refills | Status: DC

## 2017-01-08 MED ORDER — CYCLOBENZAPRINE HCL 10 MG PO TABS: 10.0000 mg | ORAL_TABLET | Freq: Every day | ORAL | 0 refills | Status: DC

## 2017-01-08 NOTE — Patient Instructions (Addendum)
Back Exercises If you have pain in your back, do these exercises 2-3 times each day or as told by your doctor. When the pain goes away, do the exercises once each day, but repeat the steps more times for each exercise (do more repetitions). If you do not have pain in your back, do these exercises once each day or as told by your doctor. Exercises Single Knee to Chest  Do these steps 3-5 times in a row for each leg: 1. Lie on your back on a firm bed or the floor with your legs stretched out. 2. Bring one knee to your chest. 3. Hold your knee to your chest by grabbing your knee or thigh. 4. Pull on your knee until you feel a gentle stretch in your lower back. 5. Keep doing the stretch for 10-30 seconds. 6. Slowly let go of your leg and straighten it.  Pelvic Tilt  Do these steps 5-10 times in a row: 1. Lie on your back on a firm bed or the floor with your legs stretched out. 2. Bend your knees so they point up to the ceiling. Your feet should be flat on the floor. 3. Tighten your lower belly (abdomen) muscles to press your lower back against the floor. This will make your tailbone point up to the ceiling instead of pointing down to your feet or the floor. 4. Stay in this position for 5-10 seconds while you gently tighten your muscles and breathe evenly.  Cat-Cow  Do these steps until your lower back bends more easily: 1. Get on your hands and knees on a firm surface. Keep your hands under your shoulders, and keep your knees under your hips. You may put padding under your knees. 2. Let your head hang down, and make your tailbone point down to the floor so your lower back is round like the back of a cat. 3. Stay in this position for 5 seconds. 4. Slowly lift your head and make your tailbone point up to the ceiling so your back hangs low (sags) like the back of a cow. 5. Stay in this position for 5 seconds.  Press-Ups  Do these steps 5-10 times in a row: 1. Lie on your belly (face-down)  on the floor. 2. Place your hands near your head, about shoulder-width apart. 3. While you keep your back relaxed and keep your hips on the floor, slowly straighten your arms to raise the top half of your body and lift your shoulders. Do not use your back muscles. To make yourself more comfortable, you may change where you place your hands. 4. Stay in this position for 5 seconds. 5. Slowly return to lying flat on the floor.  Bridges  Do these steps 10 times in a row: 1. Lie on your back on a firm surface. 2. Bend your knees so they point up to the ceiling. Your feet should be flat on the floor. 3. Tighten your butt muscles and lift your butt off of the floor until your waist is almost as high as your knees. If you do not feel the muscles working in your butt and the back of your thighs, slide your feet 1-2 inches farther away from your butt. 4. Stay in this position for 3-5 seconds. 5. Slowly lower your butt to the floor, and let your butt muscles relax.  If this exercise is too easy, try doing it with your arms crossed over your chest. Belly Crunches  Do these steps 5-10 times in   a row: 1. Lie on your back on a firm bed or the floor with your legs stretched out. 2. Bend your knees so they point up to the ceiling. Your feet should be flat on the floor. 3. Cross your arms over your chest. 4. Tip your chin a little bit toward your chest but do not bend your neck. 5. Tighten your belly muscles and slowly raise your chest just enough to lift your shoulder blades a tiny bit off of the floor. 6. Slowly lower your chest and your head to the floor.  Back Lifts Do these steps 5-10 times in a row: 1. Lie on your belly (face-down) with your arms at your sides, and rest your forehead on the floor. 2. Tighten the muscles in your legs and your butt. 3. Slowly lift your chest off of the floor while you keep your hips on the floor. Keep the back of your head in line with the curve in your back. Look at  the floor while you do this. 4. Stay in this position for 3-5 seconds. 5. Slowly lower your chest and your face to the floor.  Contact a doctor if:  Your back pain gets a lot worse when you do an exercise.  Your back pain does not lessen 2 hours after you exercise. If you have any of these problems, stop doing the exercises. Do not do them again unless your doctor says it is okay. Get help right away if:  You have sudden, very bad back pain. If this happens, stop doing the exercises. Do not do them again unless your doctor says it is okay. This information is not intended to replace advice given to you by your health care provider. Make sure you discuss any questions you have with your health care provider. Document Released: 06/08/2010 Document Revised: 10/12/2015 Document Reviewed: 06/30/2014 Elsevier Interactive Patient Education  2018 Elsevier Inc.  Back Pain, Adult Back pain is very common. The pain often gets better over time. The cause of back pain is usually not dangerous. Most people can learn to manage their back pain on their own. Follow these instructions at home: Watch your back pain for any changes. The following actions may help to lessen any pain you are feeling:  Stay active. Start with short walks on flat ground if you can. Try to walk farther each day.  Exercise regularly as told by your doctor. Exercise helps your back heal faster. It also helps avoid future injury by keeping your muscles strong and flexible.  Do not sit, drive, or stand in one place for more than 30 minutes.  Do not stay in bed. Resting more than 1-2 days can slow down your recovery.  Be careful when you bend or lift an object. Use good form when lifting: ? Bend at your knees. ? Keep the object close to your body. ? Do not twist.  Sleep on a firm mattress. Lie on your side, and bend your knees. If you lie on your back, put a pillow under your knees.  Take medicines only as told by your  doctor.  Put ice on the injured area. ? Put ice in a plastic bag. ? Place a towel between your skin and the bag. ? Leave the ice on for 20 minutes, 2-3 times a day for the first 2-3 days. After that, you can switch between ice and heat packs.  Avoid feeling anxious or stressed. Find good ways to deal with stress, such as exercise.    Maintain a healthy weight. Extra weight puts stress on your back.  Contact a doctor if:  You have pain that does not go away with rest or medicine.  You have worsening pain that goes down into your legs or buttocks.  You have pain that does not get better in one week.  You have pain at night.  You lose weight.  You have a fever or chills. Get help right away if:  You cannot control when you poop (bowel movement) or pee (urinate).  Your arms or legs feel weak.  Your arms or legs lose feeling (numbness).  You feel sick to your stomach (nauseous) or throw up (vomit).  You have belly (abdominal) pain.  You feel like you may pass out (faint). This information is not intended to replace advice given to you by your health care provider. Make sure you discuss any questions you have with your health care provider. Document Released: 10/23/2007 Document Revised: 10/12/2015 Document Reviewed: 09/07/2013 Elsevier Interactive Patient Education  2018 Elsevier Inc.  

## 2017-01-08 NOTE — Progress Notes (Signed)
Subjective:    Patient ID: Tracey Terrell, female    DOB: May 02, 1961, 56 y.o.   MRN: [id number]  Chief Complaint  Patient presents with  . Back Pain    HPI Patient was seen today for acute complaint.  Back pain: -started 8 days ago after running in the park. -noted as sharp pains in b/l lower back.  Denies loss of bowel or bladder, hearing any pops or feeling any tears, numbness or tingling in LEs. -Pt has tried Advil and Motrin for the pain. -At times pain is so severe that pt has to stop/feels like leg is going to give out.  Past Medical History:  Diagnosis Date  . Allergy   . Blood transfusion without reported diagnosis   . GERD (gastroesophageal reflux disease)   . Heart murmur   . Migraines   . Ulcer   . UTI (urinary tract infection)     Past Surgical History:  Procedure Laterality Date  . CESAREAN SECTION      Family History  Problem Relation Age of Onset  . Arthritis Mother   . Cancer Mother   . Heart disease Mother   . Hypertension Mother   . Diabetes Mother   . Arthritis Father   . Cancer Father   . Heart disease Father   . Hypertension Father   . Diabetes Father   . Hypertension Sister   . Hypertension Brother   . Diabetes Maternal Grandmother   . Hypertension Maternal Grandfather   . Arthritis Maternal Grandfather     Social History   Social History  . Marital status: Single    Spouse name: N/A  . Number of children: N/A  . Years of education: N/A   Occupational History  . Not on file.   Social History Main Topics  . Smoking status: Never Smoker  . Smokeless tobacco: Never Used  . Alcohol use 0.6 oz/week    1 Glasses of wine per week  . Drug use: No  . Sexual activity: Yes     Comment: TUBAL  LIGATION   Other Topics Concern  . Not on file   Social History Narrative  . No narrative on file    Outpatient Medications Prior to Visit  Medication Sig Dispense Refill  . amLODipine (NORVASC) 5 MG tablet Take 1 tablet (5 mg total) by  mouth daily. 30 tablet 0  . cetirizine (ZYRTEC) 10 MG tablet Take 1 tablet (10 mg total) by mouth daily. 30 tablet 11  . hydrochlorothiazide (HYDRODIURIL) 25 MG tablet Take 1 tablet (25 mg total) by mouth daily. 30 tablet 0  . ibuprofen (ADVIL,MOTRIN) 200 MG tablet Take 200 mg by mouth every 6 (six) hours as needed for mild pain (S NEEDED FOR MILD PAIN).      No facility-administered medications prior to visit.     Allergies  Allergen Reactions  . Naproxen Anaphylaxis    ROS General: Denies fever, chills, night sweats, changes in weight, changes in appetite HEENT: Denies headaches, ear pain, changes in vision, rhinorrhea, sore throat CV: Denies CP, palpitations, SOB, orthopnea Pulm: Denies SOB, cough, wheezing GI: Denies abdominal pain, nausea, vomiting, diarrhea, constipation GU: Denies dysuria, hematuria, frequency, vaginal discharge Msk: Denies muscle cramps, joint pains +low back pain Neuro: Denies weakness, numbness, tingling Skin: Denies rashes, bruising Psych: Denies depression, anxiety, hallucinations     Objective:    Blood pressure 104/82, pulse 70, temperature 98.5 F (36.9 C), temperature source Oral, weight 154 lb (69.9 kg), SpO2 98 %.  Gen. Pleasant, well-nourished, in no distress, normal affect HEENT: [place]/AT, conjunctiva clear, no scleral icterus, nares patent without drainage. Lungs: no accessory muscle use, CTAB, no wheezes or rales Cardiovascular: RR, heart sounds  normal, no m/r/g Abdomen: soft and non-tender, no hepatosplenomegaly, BS normal. Musculoskeletal: No deformities, mild TTP of lumbar spine and b/l paraspinal muscles, normal tone.  +straight leg raise b/l, pain relieved by flexing leg Neuro:  A&Ox3, CN II-XII intact, normal gait Skin:  Warm, no lesions/ rash   Wt Readings from Last 3 Encounters:  01/08/17 154 lb (69.9 kg)  12/24/16 150 lb (68 kg)  11/07/16 154 lb (69.9 kg)    Lab Results  Component Value Date   WBC 3.8 (L) 07/11/2016    HGB 12.0 07/11/2016   HCT 36.6 07/11/2016   PLT 227.0 07/11/2016   GLUCOSE 91 07/11/2016   ALT 13 07/11/2016   AST 17 07/11/2016   NA 141 07/11/2016   K 4.0 07/11/2016   CL 106 07/11/2016   CREATININE 0.78 07/11/2016   BUN 13 07/11/2016   CO2 31 07/11/2016    Assessment/Plan:  Acute bilateral low back pain without sciatica  -Discussed typical course of low back pain -given low back exercises -Discussed Tylenol, heat, massage, movement (not sitting for long periods of time) -Plan: cyclobenzaprine (FLEXERIL) 10 MG tablet, predniSONE (DELTASONE) 10 MG tablet, 7 day taper.   RTC precautions given.  F/u prn.  Pt has a f/u appt on Friday 8/24 for HTN.  Consider adjusting therapy. BP today 104/82

## 2017-01-10 ENCOUNTER — Ambulatory Visit: Payer: BLUE CROSS/BLUE SHIELD | Admitting: Family Medicine

## 2017-01-21 ENCOUNTER — Encounter: Payer: Self-pay | Admitting: Family Medicine

## 2017-01-21 ENCOUNTER — Ambulatory Visit (INDEPENDENT_AMBULATORY_CARE_PROVIDER_SITE_OTHER): Payer: BLUE CROSS/BLUE SHIELD | Admitting: Family Medicine

## 2017-01-21 VITALS — BP 102/78 | HR 115 | Temp 102.5°F | Ht <= 58 in | Wt 151.3 lb

## 2017-01-21 DIAGNOSIS — J020 Streptococcal pharyngitis: Secondary | ICD-10-CM | POA: Diagnosis not present

## 2017-01-21 LAB — POCT RAPID STREP A (OFFICE): Rapid Strep A Screen: POSITIVE — AB

## 2017-01-21 MED ORDER — AMOXICILLIN 500 MG PO CAPS: 500.0000 mg | ORAL_CAPSULE | Freq: Two times a day (BID) | ORAL | 0 refills | Status: AC

## 2017-01-21 NOTE — Progress Notes (Signed)
Subjective:    Patient ID: Tracey Terrell, female    DOB: [birth year], 56 y.o.   MRN: [id number]  Chief Complaint  Patient presents with  . Sore Throat    x2 days, temperature of 102.6 at home    HPI Patient was seen today for acute visit.  Sore throat: -started Saturday.  Also with fever.  Tried to go to urgent care, but unable to make it before they closed. -Tmax at home 100F -Has tried Advil and motrin -Endorses body aches, rhinorrhea/stuffy nose, increased ear pressure, decreased appetite. -Denies HA, N/V, cough, sick contacts.  Past Medical History:  Diagnosis Date  . Allergy   . Blood transfusion without reported diagnosis   . GERD (gastroesophageal reflux disease)   . Heart murmur   . Migraines   . Ulcer   . UTI (urinary tract infection)     Past Surgical History:  Procedure Laterality Date  . CESAREAN SECTION      Family History  Problem Relation Age of Onset  . Arthritis Mother   . Cancer Mother   . Heart disease Mother   . Hypertension Mother   . Diabetes Mother   . Arthritis Father   . Cancer Father   . Heart disease Father   . Hypertension Father   . Diabetes Father   . Hypertension Sister   . Hypertension Brother   . Diabetes Maternal Grandmother   . Hypertension Maternal Grandfather   . Arthritis Maternal Grandfather     Social History   Social History  . Marital status: Single    Spouse name: N/A  . Number of children: N/A  . Years of education: N/A   Occupational History  . Not on file.   Social History Main Topics  . Smoking status: Former Games developer  . Smokeless tobacco: Never Used  . Alcohol use 0.6 oz/week    1 Glasses of wine per week  . Drug use: No  . Sexual activity: Yes     Comment: TUBAL  LIGATION   Other Topics Concern  . Not on file   Social History Narrative  . No narrative on file    Outpatient Medications Prior to Visit  Medication Sig Dispense Refill  . amLODipine (NORVASC) 5 MG tablet Take 1 tablet (5 mg  total) by mouth daily. 30 tablet 0  . cetirizine (ZYRTEC) 10 MG tablet Take 1 tablet (10 mg total) by mouth daily. 30 tablet 11  . cyclobenzaprine (FLEXERIL) 10 MG tablet Take 1 tablet (10 mg total) by mouth at bedtime. If medicine does not make you feel sleepy, you can also take 1 tab in the morning as needed. 15 tablet 0  . hydrochlorothiazide (HYDRODIURIL) 25 MG tablet Take 1 tablet (25 mg total) by mouth daily. 30 tablet 0  . ibuprofen (ADVIL,MOTRIN) 200 MG tablet Take 200 mg by mouth every 6 (six) hours as needed for mild pain (S NEEDED FOR MILD PAIN).     Marland Kitchen predniSONE (DELTASONE) 10 MG tablet Take 4 pills for three days, then 3 pills for two days, 2 pills for 1 day, then 1 pill for 1 day. 21 tablet 0   No facility-administered medications prior to visit.     Allergies  Allergen Reactions  . Naproxen Anaphylaxis    ROS General: Denies chills, night sweats, changes in weight  +fever, decreased appetite, body aches  HEENT: Denies headaches, ear pain, changes in vision.  +rhinorrhea, stuffy nose, increased ear pressure, sore throat. CV: Denies CP, palpitations, SOB,  orthopnea Pulm: Denies SOB, cough, wheezing GI: Denies abdominal pain, nausea, vomiting, diarrhea, constipation GU: Denies dysuria, hematuria, frequency, vaginal discharge Msk: Denies muscle cramps, joint pains Neuro: Denies weakness, numbness, tingling Skin: Denies rashes, bruising Psych: Denies depression, anxiety, hallucinations     Objective:    Blood pressure 102/78, pulse (!) 115, temperature (!) 102.5 F (39.2 C), temperature source Oral, height 3\' 4"  (1.016 m), weight 151 lb 4.8 oz (68.6 kg).   Gen. Pleasant, well-nourished, in no distress, but appears sick HEENT: [place]/AT, face symmetric, conjunctiva clear, no scleral icterus, PERRLA, nares patent with clear drainage, pharynx with erythema and mild exudate on R. Neck: TTP of L preauricular node. Lungs: no accessory muscle use, CTAB, no wheezes or  rales Cardiovascular: RR, heart sounds  normal, no m/r/g Abdomen: soft and non-tender, no hepatosplenomegaly, BS normal. Neuro:  A&Ox3, CN II-XII intact, normal gait Skin:  Warm, no lesions/ rash   Wt Readings from Last 3 Encounters:  01/21/17 151 lb 4.8 oz (68.6 kg)  01/08/17 154 lb (69.9 kg)  12/24/16 150 lb (68 kg)    Diabetic Foot Exam - Simple   No data filed     Lab Results  Component Value Date   WBC 3.8 (L) 07/11/2016   HGB 12.0 07/11/2016   HCT 36.6 07/11/2016   PLT 227.0 07/11/2016   GLUCOSE 91 07/11/2016   ALT 13 07/11/2016   AST 17 07/11/2016   NA 141 07/11/2016   K 4.0 07/11/2016   CL 106 07/11/2016   CREATININE 0.78 07/11/2016   BUN 13 07/11/2016   CO2 31 07/11/2016    Assessment/Plan:  Strep pharyngitis - Plan: POCT rapid strep A, amoxicillin (AMOXIL) 500 MG capsule  Supportive care.  Given RTC precautions. Continue NSAIDs Tracey Terrell is a 56 y.o. female presenting with a sore throat for 4 days.  Associated symptoms include:  fever, nasal/sinus congestion, runny nose, ear fullness and body aches, sore throat..  Symptoms are constant.  Home treatment thus far includes:  rest, hydration and NSAIDS/acetaminophen.  No known sick contacts with similar symptoms.  There is no history of of similar symptoms.  Exam:  BP 102/78   Pulse (!) 115   Temp (!) 102.5 F (39.2 C) (Oral)   Ht 3\' 4"  (1.016 m)   Wt 151 lb 4.8 oz (68.6 kg)   BMI 66.48 kg/m  Constitutional Appears sick/uncomfortable, pleasant. HEENT  [place]/AT, face symmetric, conjunctiva clear, no scleral icterus, PERRLA, nares patent with clear drainage, pharynx with erythema and mild exudate on R. Neck TTP of L preauricular node. Heart RRR, no r/g/m Lungs CTAB, no wheezes or rales.  No accessory muscle use Skin warm, dry, intact

## 2017-01-21 NOTE — Patient Instructions (Addendum)

## 2017-04-25 DIAGNOSIS — Z1231 Encounter for screening mammogram for malignant neoplasm of breast: Secondary | ICD-10-CM | POA: Diagnosis not present

## 2017-07-18 DIAGNOSIS — Z01419 Encounter for gynecological examination (general) (routine) without abnormal findings: Secondary | ICD-10-CM | POA: Diagnosis not present

## 2017-07-18 DIAGNOSIS — N951 Menopausal and female climacteric states: Secondary | ICD-10-CM | POA: Diagnosis not present

## 2017-07-18 DIAGNOSIS — G4700 Insomnia, unspecified: Secondary | ICD-10-CM | POA: Diagnosis not present

## 2017-08-13 DIAGNOSIS — K529 Noninfective gastroenteritis and colitis, unspecified: Secondary | ICD-10-CM | POA: Diagnosis not present

## 2017-08-18 DIAGNOSIS — E7800 Pure hypercholesterolemia, unspecified: Secondary | ICD-10-CM | POA: Diagnosis not present

## 2017-08-18 DIAGNOSIS — R030 Elevated blood-pressure reading, without diagnosis of hypertension: Secondary | ICD-10-CM | POA: Diagnosis not present

## 2017-08-18 DIAGNOSIS — I10 Essential (primary) hypertension: Secondary | ICD-10-CM | POA: Diagnosis not present

## 2017-09-08 DIAGNOSIS — I10 Essential (primary) hypertension: Secondary | ICD-10-CM | POA: Diagnosis not present

## 2017-09-08 DIAGNOSIS — H8110 Benign paroxysmal vertigo, unspecified ear: Secondary | ICD-10-CM | POA: Diagnosis not present

## 2017-09-17 DIAGNOSIS — J320 Chronic maxillary sinusitis: Secondary | ICD-10-CM | POA: Diagnosis not present

## 2017-09-17 DIAGNOSIS — R6889 Other general symptoms and signs: Secondary | ICD-10-CM | POA: Diagnosis not present

## 2017-09-17 DIAGNOSIS — R509 Fever, unspecified: Secondary | ICD-10-CM | POA: Diagnosis not present

## 2018-02-20 DIAGNOSIS — I10 Essential (primary) hypertension: Secondary | ICD-10-CM | POA: Diagnosis not present

## 2018-03-19 ENCOUNTER — Emergency Department (INDEPENDENT_AMBULATORY_CARE_PROVIDER_SITE_OTHER): Payer: BLUE CROSS/BLUE SHIELD

## 2018-03-19 ENCOUNTER — Other Ambulatory Visit: Payer: Self-pay

## 2018-03-19 ENCOUNTER — Emergency Department (INDEPENDENT_AMBULATORY_CARE_PROVIDER_SITE_OTHER)
Admission: EM | Admit: 2018-03-19 | Discharge: 2018-03-19 | Disposition: A | Discharge status: Home / Self Care | Payer: BLUE CROSS/BLUE SHIELD | Source: Home / Self Care | Attending: Family Medicine | Admitting: Family Medicine

## 2018-03-19 DIAGNOSIS — R0789 Other chest pain: Secondary | ICD-10-CM | POA: Diagnosis not present

## 2018-03-19 DIAGNOSIS — M948X8 Other specified disorders of cartilage, other site: Secondary | ICD-10-CM

## 2018-03-19 DIAGNOSIS — R072 Precordial pain: Secondary | ICD-10-CM | POA: Diagnosis not present

## 2018-03-19 DIAGNOSIS — M7989 Other specified soft tissue disorders: Secondary | ICD-10-CM

## 2018-03-19 MED ORDER — PREDNISONE 20 MG PO TABS: ORAL_TABLET | ORAL | 0 refills | Status: DC

## 2018-03-19 NOTE — ED Provider Notes (Addendum)
[facility]    CSN: [id number] Arrival date & time: 03/19/18  1752     History   Chief Complaint Chief Complaint  Patient presents with  . Mass    HPI Tracey Terrell is a 57 y.o. female.   Patient reports that she has been moving into a house during the past 1.5 weeks.  During the past 2 to 3 days she has felt a painful "knot" between her breasts at the lower aspect of her sternum.  She recalls no injury but has been lifting heavy boxes and household items.  The history is provided by the patient.  Chest Pain  Pain location:  Substernal area Pain quality: aching   Pain radiates to:  Does not radiate Pain severity:  Mild Onset quality:  Gradual Duration:  3 days Timing:  Constant Progression:  Unchanged Chronicity:  New Context: lifting   Relieved by:  None tried Exacerbated by: palpation. Ineffective treatments:  None tried Associated symptoms: no diaphoresis and no fever   Associated symptoms comment:  Swelling   Past Medical History:  Diagnosis Date  . Allergy   . Blood transfusion without reported diagnosis   . GERD (gastroesophageal reflux disease)   . Heart murmur   . Migraines   . Ulcer   . UTI (urinary tract infection)     Patient Active Problem List   Diagnosis Date Noted  . Hypertension, essential 11/07/2016  . 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 CESAREAN SECTION         Home Medications    Prior to Admission medications   Medication Sig Start Date End Date Taking? Authorizing Provider  amLODipine (NORVASC) 5 MG tablet Take 1 tablet (5 mg total) by mouth daily. 12/24/16   Nelwyn Salisbury, MD  cetirizine (ZYRTEC) 10 MG tablet Take 1 tablet (10 mg total) by mouth daily. 12/24/16   Nelwyn Salisbury, MD  cyclobenzaprine (FLEXERIL) 10 MG tablet Take 1 tablet (10 mg total) by mouth at bedtime. If medicine does not make you feel sleepy, you can also take 1 tab in the morning as needed.  01/08/17   Deeann Saint, MD  hydrochlorothiazide (HYDRODIURIL) 25 MG tablet Take 1 tablet (25 mg total) by mouth daily. 12/24/16   Nelwyn Salisbury, MD  ibuprofen (ADVIL,MOTRIN) 200 MG tablet Take 200 mg by mouth every 6 (six) hours as needed for mild pain (S NEEDED FOR MILD PAIN).     [provider]  lisinopril-hydrochlorothiazide (PRINZIDE,ZESTORETIC) 20-25 MG tablet Take 1 tablet by mouth daily. 02/16/18   [provider]  predniSONE (DELTASONE) 20 MG tablet Take one tab by mouth twice daily for 4 days, then one daily. Take with food. 03/19/18   Lattie Haw, MD    Family History Family History  Problem Relation Age of Onset  . Arthritis Mother   . Cancer Mother   . Heart disease Mother   . Hypertension Mother   . Diabetes Mother   . Arthritis Father   . Cancer Father   . Heart disease Father   . Hypertension Father   . Diabetes Father   . Hypertension Sister   . Hypertension Brother   . Diabetes Maternal Grandmother   . Hypertension Maternal Grandfather   . Arthritis Maternal Grandfather     Social History Social History   Tobacco Use  . Smoking status: Former Games developer  . Smokeless tobacco: Never Used  Substance Use Topics  .  Alcohol use: Yes    Alcohol/week: 1.0 standard drinks    Types: 1 Glasses of wine per week  . Drug use: No     Allergies   Naproxen   Review of Systems Review of Systems  Constitutional: Negative for chills, diaphoresis and fever.  HENT: Negative.   Eyes: Negative.   Respiratory: Negative for chest tightness.   Cardiovascular: Positive for chest pain.  Gastrointestinal: Negative.   Genitourinary: Negative.   Skin: Negative for color change and rash.     Physical Exam Triage Vital Signs ED Triage Vitals  Enc Vitals Group     BP 03/19/18 1821 118/77     Pulse Rate 03/19/18 1821 70     Resp --      Temp 03/19/18 1821 98.2 F (36.8 C)     Temp Source 03/19/18 1821 Oral     SpO2 03/19/18 1821 99 %     Weight  03/19/18 1822 155 lb (70.3 kg)     Height 03/19/18 1822 5\' 1"  (1.549 m)     Head Circumference --      Peak Flow --      Pain Score 03/19/18 1821 0     Pain Loc --      Pain Edu? --      Excl. in GC? --    No data found.  Updated Vital Signs BP 118/77 (BP Location: Right Arm)   Pulse 70   Temp 98.2 F (36.8 C) (Oral)   Ht 5\' 1"  (1.549 m)   Wt 70.3 kg   SpO2 99%   BMI 29.29 kg/m   Visual Acuity Right Eye Distance:   Left Eye Distance:   Bilateral Distance:    Right Eye Near:   Left Eye Near:    Bilateral Near:     Physical Exam  Constitutional: She appears well-developed and well-nourished. No distress.  HENT:  Head: Normocephalic.  Eyes: Pupils are equal, round, and reactive to light.  Cardiovascular: Normal rate.  Pulmonary/Chest: Effort normal.  There is mild swelling and tenderness to palpation over the xiphisternal joint.  No overlying erythema or warmth.    Abdominal: There is no tenderness.  Neurological: She is alert.  Skin: Skin is warm and dry. No rash noted.  Nursing note and vitals reviewed.    UC Treatments / Results  Labs (all labs ordered are listed, but only abnormal results are displayed) Labs Reviewed - No data to display  EKG None  Radiology Dg Sternum  Result Date: 03/19/2018 CLINICAL DATA:  Two day history of pain and swelling along the xiphoid process. EXAM: STERNUM - 2+ VIEW COMPARISON:  None. FINDINGS: The cardiac silhouette, mediastinal and hilar contours are normal. The lungs are clear. Moderate eventration of the right hemidiaphragm. The bony thorax is intact. Specifically, I do not see any evidence of a sternal injury or bone lesion. No substernal soft tissue swelling. IMPRESSION: Normal radiographs. Electronically Signed   By: Rudie Meyer M.D.   On: 03/19/2018 19:00    Procedures Procedures (including critical care time)  Medications Ordered in UC Medications - No data to display  Initial Impression / Assessment and  Plan / UC Course  I have reviewed the triage vital signs and the nursing notes.  Pertinent labs & imaging results that were available during my care of the patient were reviewed by me and considered in my medical decision making (see chart for details).    Begin prednisone burst/taper. Followup with Dr. Rodney Langton or  Dr. Clementeen Graham ([facility]) if not improving about two weeks.    Final Clinical Impressions(s) / UC Diagnoses   Final diagnoses:  Xiphoiditis     Discharge Instructions     Apply ice pack for 20 to 30 minutes, 3 to 4 times daily  Continue until pain and swelling decrease.  May take Tylenol as needed for pain. After finishing prednisone, may switch to Ibuprofen 200mg , 4 tabs every 8 hours with food.     ED Prescriptions    Medication Sig Dispense Auth. Provider   predniSONE (DELTASONE) 20 MG tablet Take one tab by mouth twice daily for 4 days, then one daily. Take with food. 11 tablet Lattie Haw, MD         Lattie Haw, MD 03/22/18 1327    Lattie Haw, MD 03/22/18 1329

## 2018-03-19 NOTE — ED Triage Notes (Signed)
Pt has knot between breast.  Has been there about 3 days.  Pt stated that it kept her up at night.

## 2018-03-19 NOTE — Discharge Instructions (Addendum)
Apply ice pack for 20 to 30 minutes, 3 to 4 times daily  Continue until pain and swelling decrease.  May take Tylenol as needed for pain. After finishing prednisone, may switch to Ibuprofen 200mg , 4 tabs every 8 hours with food.

## 2018-04-24 ENCOUNTER — Encounter: Payer: Self-pay | Admitting: Physician Assistant

## 2018-04-24 ENCOUNTER — Ambulatory Visit (INDEPENDENT_AMBULATORY_CARE_PROVIDER_SITE_OTHER): Payer: BLUE CROSS/BLUE SHIELD | Admitting: Physician Assistant

## 2018-04-24 VITALS — BP 109/71 | HR 89 | Ht 62.0 in | Wt 155.0 lb

## 2018-04-24 DIAGNOSIS — Z7689 Persons encountering health services in other specified circumstances: Secondary | ICD-10-CM

## 2018-04-24 DIAGNOSIS — Z800 Family history of malignant neoplasm of digestive organs: Secondary | ICD-10-CM | POA: Diagnosis not present

## 2018-04-24 DIAGNOSIS — Z1231 Encounter for screening mammogram for malignant neoplasm of breast: Secondary | ICD-10-CM

## 2018-04-24 DIAGNOSIS — Z1211 Encounter for screening for malignant neoplasm of colon: Secondary | ICD-10-CM

## 2018-04-24 DIAGNOSIS — I10 Essential (primary) hypertension: Secondary | ICD-10-CM

## 2018-04-24 DIAGNOSIS — Z9109 Other allergy status, other than to drugs and biological substances: Secondary | ICD-10-CM

## 2018-04-24 DIAGNOSIS — Z91018 Allergy to other foods: Secondary | ICD-10-CM

## 2018-04-24 MED ORDER — OLOPATADINE HCL 0.2 % OP SOLN: 1.0000 [drp] | Freq: Every day | OPHTHALMIC | 5 refills | Status: DC

## 2018-04-24 MED ORDER — LEVOCETIRIZINE DIHYDROCHLORIDE 5 MG PO TABS: 5.0000 mg | ORAL_TABLET | Freq: Every evening | ORAL | 3 refills | Status: DC

## 2018-04-24 MED ORDER — EPINEPHRINE 0.3 MG/0.3ML IJ SOAJ: 0.3000 mg | Freq: Once | INTRAMUSCULAR | 2 refills | Status: DC | PRN

## 2018-04-24 NOTE — Patient Instructions (Addendum)
For your blood pressure: - Goal <130/80 (Ideally 120's/70's) - optional baby aspirin 81 mg daily to help prevent heart attack/stroke - monitor and log blood pressures at home - check around the same time each day in a relaxed setting - Limit salt to <2500 mg/day - Follow DASH (Dietary Approach to Stopping Hypertension) eating plan - Try to get at least 150 minutes of aerobic exercise per week - Aim to go on a brisk walk 30 minutes per day at least 5 days per week. If you're not active, gradually increase how long you walk by 5 minutes each week - limit alcohol: 2 standard drinks per day for men and 1 per day for women - avoid tobacco/nicotine products. Consider smoking cessation if you smoke - weight loss: 7% of current body weight can reduce your blood pressure by 5-10 points - follow-up at least every 6 months for your blood pressure. Follow-up sooner if your BP is not controlled

## 2018-04-24 NOTE — Progress Notes (Signed)
HPI:                                                                Tracey Terrell is a 57 y.o. female who presents to [facility]: Primary Care Sports Medicine today to establish care  Current concerns: allergies  HTN: taking Lisinopril 20 mg daily. Compliant with medications. Does not check BP's at home. Denies vision change, headache, chest pain with exertion, orthopnea, lightheadedness, syncope and edema. Risk factors include: family hx, postmenopausal  She reports history of environmental allergies for which she takes generic Allegra.  States she recently moved into a new home and has noticed increased itchy redness and watery eyes. 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  Needs an EpiPen.  Depression screen PHQ 2/9 04/24/2018  Decreased Interest 0  Down, Depressed, Hopeless 0  PHQ - 2 Score 0    No flowsheet data found.    Past Medical History:  Diagnosis Date  . Allergy   . Blood transfusion without reported diagnosis   . GERD (gastroesophageal reflux disease)   . Heart murmur   . Hypertension   . Migraines   . Ulcer   . UTI (urinary tract infection)    Past Surgical History:  Procedure Laterality Date  . CESAREAN SECTION     x 3   . SHOULDER ARTHROSCOPY Right 2001  . TUBAL LIGATION    . UTERINE FIBROID SURGERY  2012   Social History   Tobacco Use  . Smoking status: Former Games developermoker  . Smokeless tobacco: Never Used  Substance Use Topics  . Alcohol use: Yes    Alcohol/week: 1.0 standard drinks    Types: 1 Glasses of wine per week   family history includes Arthritis in her father, maternal grandfather, and mother; Cancer in her father and mother; Diabetes in her father, maternal grandmother, and mother; Heart disease in her father and mother; Hypertension in her brother, father, maternal grandfather, and mother.    ROS: negative except as noted in the HPI  Medications: Current Outpatient Medications   Medication Sig Dispense Refill  . lisinopril (PRINIVIL,ZESTRIL) 20 MG tablet Take 20 mg by mouth daily.    . Multiple Vitamin (MULTIVITAMIN) LIQD Take 5 mLs by mouth daily.    Marland Kitchen. EPINEPHrine 0.3 mg/0.3 mL IJ SOAJ injection Inject 0.3 mLs (0.3 mg total) into the muscle once as needed (anaphylaxis/allergic reaction). 1 Device 2  . levocetirizine (XYZAL ALLERGY 24HR) 5 MG tablet Take 1 tablet (5 mg total) by mouth every evening. 90 tablet 3  . Olopatadine HCl 0.2 % SOLN Apply 1 drop to eye daily. 2.5 mL 5   No current facility-administered medications for this visit.    Allergies  Allergen Reactions  . Naproxen Other (See Comments)    Tremor, nausea Tolerates Ibuprofen       Objective:  BP 109/71   Pulse 89   Ht 5\' 2"  (1.575 m)   Wt 155 lb (70.3 kg)   BMI 28.35 kg/m  Gen:  alert, not ill-appearing, no distress, appropriate for age HEENT: head normocephalic without obvious abnormality, conjunctiva and cornea clear, trachea midline Pulm: Normal work of breathing, normal phonation Neuro: alert and oriented x 3, no tremor MSK: extremities atraumatic, normal gait  and station Skin: intact, no rashes on exposed skin, no jaundice, no cyanosis Psych: well-groomed, cooperative, good eye contact, euthymic mood, affect mood-congruent, speech is articulate, and thought processes clear and goal-directed    No results found for this or any previous visit (from the past 72 hour(s)). No results found.    Assessment and Plan: 57 y.o. female with   .[name] was seen today for establish care.  Diagnoses and all orders for this visit:  Encounter to establish care  Breast cancer screening by mammogram -     Cancel: MM 3D SCREEN BREAST BILATERAL; Future  Hypertension goal BP (blood pressure) < 130/80  Family history of colon cancer in mother -     Ambulatory referral to Gastroenterology  Environmental allergies -     levocetirizine (XYZAL ALLERGY 24HR) 5 MG tablet; Take 1 tablet (5  mg total) by mouth every evening. -     Olopatadine HCl 0.2 % SOLN; Apply 1 drop to eye daily.  Colon cancer screening -     Ambulatory referral to Gastroenterology  Multiple food allergies Comments: brazilian nuts, grapes Orders: -     EPINEPHrine 0.3 mg/0.3 mL IJ SOAJ injection; Inject 0.3 mLs (0.3 mg total) into the muscle once as needed (anaphylaxis/allergic reaction).    - Personally reviewed PMH, PSH, PFH, medications, allergies, HM - Age-appropriate cancer screening: mammogram scheduled through employer (mobile truck); due for screen colonoscopy, referral placed; Pap UTD per patient, records requested - Influenza declined - Tdap UTD per patient - PHQ2 negative   Patient education and anticipatory guidance given Patient agrees with treatment plan Follow-up in 3 months for annual physical with fasting labs or sooner as needed if symptoms worsen or fail to improve  Levonne Hubert PA-C

## 2018-06-17 DIAGNOSIS — K635 Polyp of colon: Secondary | ICD-10-CM | POA: Diagnosis not present

## 2018-06-17 DIAGNOSIS — D125 Benign neoplasm of sigmoid colon: Secondary | ICD-10-CM | POA: Diagnosis not present

## 2018-06-17 DIAGNOSIS — Z800 Family history of malignant neoplasm of digestive organs: Secondary | ICD-10-CM | POA: Diagnosis not present

## 2018-06-17 DIAGNOSIS — Z1211 Encounter for screening for malignant neoplasm of colon: Secondary | ICD-10-CM | POA: Diagnosis not present

## 2018-06-17 DIAGNOSIS — K648 Other hemorrhoids: Secondary | ICD-10-CM | POA: Diagnosis not present

## 2018-06-17 LAB — HM COLONOSCOPY

## 2018-07-09 ENCOUNTER — Encounter: Payer: Self-pay | Admitting: Physician Assistant

## 2018-07-24 DIAGNOSIS — Z01419 Encounter for gynecological examination (general) (routine) without abnormal findings: Secondary | ICD-10-CM | POA: Diagnosis not present

## 2018-07-24 DIAGNOSIS — K4090 Unilateral inguinal hernia, without obstruction or gangrene, not specified as recurrent: Secondary | ICD-10-CM | POA: Diagnosis not present

## 2018-09-11 ENCOUNTER — Encounter: Payer: Self-pay | Admitting: Physician Assistant

## 2018-09-11 ENCOUNTER — Ambulatory Visit (INDEPENDENT_AMBULATORY_CARE_PROVIDER_SITE_OTHER): Payer: BLUE CROSS/BLUE SHIELD | Admitting: Physician Assistant

## 2018-09-11 VITALS — BP 132/92 | Temp 98.0°F | Wt 153.0 lb

## 2018-09-11 DIAGNOSIS — H00012 Hordeolum externum right lower eyelid: Secondary | ICD-10-CM | POA: Diagnosis not present

## 2018-09-11 NOTE — Progress Notes (Signed)
Virtual Visit via Video Note  I connected with Tracey ComfortBarbara Lawrie on 09/11/18 at  9:10 AM EDT by a video enabled telemedicine application and verified that I am speaking with the correct person using two identifiers.   I discussed the limitations of evaluation and management by telemedicine and the availability of in person appointments. The patient expressed understanding and agreed to proceed.  History of Present Illness: HPI:                                                                Tracey ComfortBarbara Terrell is a 58 y.o. female   CC: eye problem  Onset approx 10 days ago Pain and swelling of right lower eyelid with visible red bump No vision change, eye pain, drainage She was treated with [facility]th[facility] She also has been applying warm compresses 4 times per day Swelling has improved significantly, but still has tenderness   Past Medical History:  Diagnosis Date  . Allergy   . Blood transfusion without reported diagnosis   . GERD (gastroesophageal reflux disease)   . Heart murmur   . Hypertension   . Migraines   . Ulcer   . UTI (urinary tract infection)    Past Surgical History:  Procedure Laterality Date  . CESAREAN SECTION     x 3   . SHOULDER ARTHROSCOPY Right 2001  . TUBAL LIGATION    . UTERINE FIBROID SURGERY  2012   Social History   Tobacco Use  . Smoking status: Former Games developermoker  . Smokeless tobacco: Never Used  Substance Use Topics  . Alcohol use: Yes    Alcohol/week: 1.0 standard drinks    Types: 1 Glasses of wine per week   family history includes Arthritis in her father, maternal grandfather, and mother; Cancer in her father and mother; Diabetes in her father, maternal grandmother, and mother; Heart disease in her father and mother; Hypertension in her brother, father, maternal grandfather, and mother.    ROS: negative except as noted in the HPI  Medications: Current Outpatient Medications  Medication Sig Dispense Refill  .  levocetirizine (XYZAL ALLERGY 24HR) 5 MG tablet Take 1 tablet (5 mg total) by mouth every evening. 90 tablet 3  . Multiple Vitamin (MULTIVITAMIN) LIQD Take 5 mLs by mouth daily.    Marland Kitchen. EPINEPHrine 0.3 mg/0.3 mL IJ SOAJ injection Inject 0.3 mLs (0.3 mg total) into the muscle once as needed (anaphylaxis/allergic reaction). (Patient not taking: Reported on 09/11/2018) 1 Device 2  . lisinopril (PRINIVIL,ZESTRIL) 20 MG tablet Take 20 mg by mouth daily.    . Olopatadine HCl 0.2 % SOLN Apply 1 drop to eye daily. (Patient not taking: Reported on 09/11/2018) 2.5 mL 5   No current facility-administered medications for this visit.    Allergies  Allergen Reactions  . Naproxen Other (See Comments)    Tremor, nausea Tolerates Ibuprofen       Objective:  BP (!) 132/92   Temp 98 F (36.7 C) (Oral)   Wt 153 lb (69.4 kg)   BMI 27.98 kg/m  Physical Exam  Constitutional: She is oriented to person, place, and time and well-developed, well-nourished, and in no distress.  Eyes: Conjunctivae are normal. Right eye exhibits hordeolum. Right eye exhibits no discharge and no exudate. Left  eye exhibits no discharge, no exudate and no hordeolum.    Neurological: She is oriented to person, place, and time.     Assessment and Plan: 58 y.o. female with   .Candance was seen today for eye problem.  Diagnoses and all orders for this visit:  Hordeolum externum of right lower eyelid   Stye v. Chalazion of right lower eyelid x 10 days, gradually improving Continue warm compresses 3-5 times per day Patient education and anticipatory guidance given verbally and via MyChart Recommended she contact her eye doctor if lesion persists or worsens  Follow Up Instructions:    I discussed the assessment and treatment plan with the patient. The patient was provided an opportunity to ask questions and all were answered. The patient agreed with the plan and demonstrated an understanding of the instructions.   The  patient was advised to call back or seek an in-person evaluation if the symptoms worsen or if the condition fails to improve as anticipated.  I provided 10 minutes of non-face-to-face time during this encounter.   Carlis Stable, [place]

## 2018-12-03 DIAGNOSIS — J3089 Other allergic rhinitis: Secondary | ICD-10-CM | POA: Diagnosis not present

## 2018-12-03 DIAGNOSIS — Z1383 Encounter for screening for respiratory disorder NEC: Secondary | ICD-10-CM | POA: Diagnosis not present

## 2018-12-03 DIAGNOSIS — J301 Allergic rhinitis due to pollen: Secondary | ICD-10-CM | POA: Diagnosis not present

## 2018-12-03 DIAGNOSIS — R05 Cough: Secondary | ICD-10-CM | POA: Diagnosis not present

## 2018-12-03 DIAGNOSIS — J3081 Allergic rhinitis due to animal (cat) (dog) hair and dander: Secondary | ICD-10-CM | POA: Diagnosis not present

## 2018-12-04 DIAGNOSIS — H02881 Meibomian gland dysfunction right upper eyelid: Secondary | ICD-10-CM | POA: Diagnosis not present

## 2018-12-16 DIAGNOSIS — J301 Allergic rhinitis due to pollen: Secondary | ICD-10-CM | POA: Diagnosis not present

## 2018-12-16 DIAGNOSIS — J3089 Other allergic rhinitis: Secondary | ICD-10-CM | POA: Diagnosis not present

## 2019-01-26 DIAGNOSIS — J3081 Allergic rhinitis due to animal (cat) (dog) hair and dander: Secondary | ICD-10-CM | POA: Diagnosis not present

## 2019-01-26 DIAGNOSIS — J3089 Other allergic rhinitis: Secondary | ICD-10-CM | POA: Diagnosis not present

## 2019-01-26 DIAGNOSIS — J301 Allergic rhinitis due to pollen: Secondary | ICD-10-CM | POA: Diagnosis not present

## 2019-02-02 DIAGNOSIS — J3089 Other allergic rhinitis: Secondary | ICD-10-CM | POA: Diagnosis not present

## 2019-02-02 DIAGNOSIS — J301 Allergic rhinitis due to pollen: Secondary | ICD-10-CM | POA: Diagnosis not present

## 2019-02-02 DIAGNOSIS — J3081 Allergic rhinitis due to animal (cat) (dog) hair and dander: Secondary | ICD-10-CM | POA: Diagnosis not present

## 2019-02-09 DIAGNOSIS — J3081 Allergic rhinitis due to animal (cat) (dog) hair and dander: Secondary | ICD-10-CM | POA: Diagnosis not present

## 2019-02-09 DIAGNOSIS — J3089 Other allergic rhinitis: Secondary | ICD-10-CM | POA: Diagnosis not present

## 2019-02-09 DIAGNOSIS — J301 Allergic rhinitis due to pollen: Secondary | ICD-10-CM | POA: Diagnosis not present

## 2019-02-11 DIAGNOSIS — J301 Allergic rhinitis due to pollen: Secondary | ICD-10-CM | POA: Diagnosis not present

## 2019-02-11 DIAGNOSIS — J3089 Other allergic rhinitis: Secondary | ICD-10-CM | POA: Diagnosis not present

## 2019-02-11 DIAGNOSIS — J3081 Allergic rhinitis due to animal (cat) (dog) hair and dander: Secondary | ICD-10-CM | POA: Diagnosis not present

## 2019-02-12 DIAGNOSIS — H40013 Open angle with borderline findings, low risk, bilateral: Secondary | ICD-10-CM | POA: Diagnosis not present

## 2019-02-25 DIAGNOSIS — J3081 Allergic rhinitis due to animal (cat) (dog) hair and dander: Secondary | ICD-10-CM | POA: Diagnosis not present

## 2019-02-25 DIAGNOSIS — J3089 Other allergic rhinitis: Secondary | ICD-10-CM | POA: Diagnosis not present

## 2019-02-25 DIAGNOSIS — J301 Allergic rhinitis due to pollen: Secondary | ICD-10-CM | POA: Diagnosis not present

## 2019-03-02 DIAGNOSIS — J3089 Other allergic rhinitis: Secondary | ICD-10-CM | POA: Diagnosis not present

## 2019-03-02 DIAGNOSIS — J3081 Allergic rhinitis due to animal (cat) (dog) hair and dander: Secondary | ICD-10-CM | POA: Diagnosis not present

## 2019-03-02 DIAGNOSIS — J301 Allergic rhinitis due to pollen: Secondary | ICD-10-CM | POA: Diagnosis not present

## 2019-03-16 DIAGNOSIS — J3089 Other allergic rhinitis: Secondary | ICD-10-CM | POA: Diagnosis not present

## 2019-03-16 DIAGNOSIS — J301 Allergic rhinitis due to pollen: Secondary | ICD-10-CM | POA: Diagnosis not present

## 2019-03-16 DIAGNOSIS — J3081 Allergic rhinitis due to animal (cat) (dog) hair and dander: Secondary | ICD-10-CM | POA: Diagnosis not present

## 2019-03-18 DIAGNOSIS — J3089 Other allergic rhinitis: Secondary | ICD-10-CM | POA: Diagnosis not present

## 2019-03-18 DIAGNOSIS — J301 Allergic rhinitis due to pollen: Secondary | ICD-10-CM | POA: Diagnosis not present

## 2019-03-18 DIAGNOSIS — J3081 Allergic rhinitis due to animal (cat) (dog) hair and dander: Secondary | ICD-10-CM | POA: Diagnosis not present

## 2019-03-23 DIAGNOSIS — J3089 Other allergic rhinitis: Secondary | ICD-10-CM | POA: Diagnosis not present

## 2019-03-23 DIAGNOSIS — J3081 Allergic rhinitis due to animal (cat) (dog) hair and dander: Secondary | ICD-10-CM | POA: Diagnosis not present

## 2019-03-23 DIAGNOSIS — J301 Allergic rhinitis due to pollen: Secondary | ICD-10-CM | POA: Diagnosis not present

## 2019-03-30 DIAGNOSIS — J3089 Other allergic rhinitis: Secondary | ICD-10-CM | POA: Diagnosis not present

## 2019-03-30 DIAGNOSIS — J301 Allergic rhinitis due to pollen: Secondary | ICD-10-CM | POA: Diagnosis not present

## 2019-03-30 DIAGNOSIS — J3081 Allergic rhinitis due to animal (cat) (dog) hair and dander: Secondary | ICD-10-CM | POA: Diagnosis not present

## 2019-04-02 DIAGNOSIS — H00022 Hordeolum internum right lower eyelid: Secondary | ICD-10-CM | POA: Diagnosis not present

## 2019-04-06 DIAGNOSIS — J3081 Allergic rhinitis due to animal (cat) (dog) hair and dander: Secondary | ICD-10-CM | POA: Diagnosis not present

## 2019-04-06 DIAGNOSIS — J3089 Other allergic rhinitis: Secondary | ICD-10-CM | POA: Diagnosis not present

## 2019-04-06 DIAGNOSIS — J301 Allergic rhinitis due to pollen: Secondary | ICD-10-CM | POA: Diagnosis not present

## 2019-04-13 DIAGNOSIS — J301 Allergic rhinitis due to pollen: Secondary | ICD-10-CM | POA: Diagnosis not present

## 2019-04-13 DIAGNOSIS — J3089 Other allergic rhinitis: Secondary | ICD-10-CM | POA: Diagnosis not present

## 2019-04-13 DIAGNOSIS — J3081 Allergic rhinitis due to animal (cat) (dog) hair and dander: Secondary | ICD-10-CM | POA: Diagnosis not present

## 2019-04-22 DIAGNOSIS — J3081 Allergic rhinitis due to animal (cat) (dog) hair and dander: Secondary | ICD-10-CM | POA: Diagnosis not present

## 2019-04-22 DIAGNOSIS — J301 Allergic rhinitis due to pollen: Secondary | ICD-10-CM | POA: Diagnosis not present

## 2019-04-22 DIAGNOSIS — J3089 Other allergic rhinitis: Secondary | ICD-10-CM | POA: Diagnosis not present

## 2019-04-29 DIAGNOSIS — J301 Allergic rhinitis due to pollen: Secondary | ICD-10-CM | POA: Diagnosis not present

## 2019-04-29 DIAGNOSIS — J3089 Other allergic rhinitis: Secondary | ICD-10-CM | POA: Diagnosis not present

## 2019-04-29 DIAGNOSIS — J3081 Allergic rhinitis due to animal (cat) (dog) hair and dander: Secondary | ICD-10-CM | POA: Diagnosis not present

## 2019-05-06 DIAGNOSIS — J3081 Allergic rhinitis due to animal (cat) (dog) hair and dander: Secondary | ICD-10-CM | POA: Diagnosis not present

## 2019-05-06 DIAGNOSIS — J3089 Other allergic rhinitis: Secondary | ICD-10-CM | POA: Diagnosis not present

## 2019-05-06 DIAGNOSIS — J301 Allergic rhinitis due to pollen: Secondary | ICD-10-CM | POA: Diagnosis not present

## 2019-05-27 DIAGNOSIS — J3089 Other allergic rhinitis: Secondary | ICD-10-CM | POA: Diagnosis not present

## 2019-05-27 DIAGNOSIS — J3081 Allergic rhinitis due to animal (cat) (dog) hair and dander: Secondary | ICD-10-CM | POA: Diagnosis not present

## 2019-05-27 DIAGNOSIS — J301 Allergic rhinitis due to pollen: Secondary | ICD-10-CM | POA: Diagnosis not present

## 2019-06-03 DIAGNOSIS — J3089 Other allergic rhinitis: Secondary | ICD-10-CM | POA: Diagnosis not present

## 2019-06-03 DIAGNOSIS — J301 Allergic rhinitis due to pollen: Secondary | ICD-10-CM | POA: Diagnosis not present

## 2019-06-03 DIAGNOSIS — J3081 Allergic rhinitis due to animal (cat) (dog) hair and dander: Secondary | ICD-10-CM | POA: Diagnosis not present

## 2019-06-10 DIAGNOSIS — J3089 Other allergic rhinitis: Secondary | ICD-10-CM | POA: Diagnosis not present

## 2019-06-10 DIAGNOSIS — J3081 Allergic rhinitis due to animal (cat) (dog) hair and dander: Secondary | ICD-10-CM | POA: Diagnosis not present

## 2019-06-10 DIAGNOSIS — J301 Allergic rhinitis due to pollen: Secondary | ICD-10-CM | POA: Diagnosis not present

## 2019-06-24 DIAGNOSIS — J3089 Other allergic rhinitis: Secondary | ICD-10-CM | POA: Diagnosis not present

## 2019-06-24 DIAGNOSIS — J3081 Allergic rhinitis due to animal (cat) (dog) hair and dander: Secondary | ICD-10-CM | POA: Diagnosis not present

## 2019-06-24 DIAGNOSIS — J301 Allergic rhinitis due to pollen: Secondary | ICD-10-CM | POA: Diagnosis not present

## 2019-07-01 DIAGNOSIS — J301 Allergic rhinitis due to pollen: Secondary | ICD-10-CM | POA: Diagnosis not present

## 2019-07-01 DIAGNOSIS — J3089 Other allergic rhinitis: Secondary | ICD-10-CM | POA: Diagnosis not present

## 2019-07-01 DIAGNOSIS — J3081 Allergic rhinitis due to animal (cat) (dog) hair and dander: Secondary | ICD-10-CM | POA: Diagnosis not present

## 2019-07-21 DIAGNOSIS — M25519 Pain in unspecified shoulder: Secondary | ICD-10-CM | POA: Diagnosis not present

## 2019-07-26 DIAGNOSIS — J301 Allergic rhinitis due to pollen: Secondary | ICD-10-CM | POA: Diagnosis not present

## 2019-07-26 DIAGNOSIS — J3081 Allergic rhinitis due to animal (cat) (dog) hair and dander: Secondary | ICD-10-CM | POA: Diagnosis not present

## 2019-07-26 DIAGNOSIS — J3089 Other allergic rhinitis: Secondary | ICD-10-CM | POA: Diagnosis not present

## 2019-07-27 ENCOUNTER — Encounter: Payer: Self-pay | Admitting: Medical-Surgical

## 2019-07-30 DIAGNOSIS — Z01419 Encounter for gynecological examination (general) (routine) without abnormal findings: Secondary | ICD-10-CM | POA: Diagnosis not present

## 2019-08-04 DIAGNOSIS — Z03818 Encounter for observation for suspected exposure to other biological agents ruled out: Secondary | ICD-10-CM | POA: Diagnosis not present

## 2019-08-04 DIAGNOSIS — Z20828 Contact with and (suspected) exposure to other viral communicable diseases: Secondary | ICD-10-CM | POA: Diagnosis not present

## 2019-08-05 DIAGNOSIS — J3089 Other allergic rhinitis: Secondary | ICD-10-CM | POA: Diagnosis not present

## 2019-08-05 DIAGNOSIS — J301 Allergic rhinitis due to pollen: Secondary | ICD-10-CM | POA: Diagnosis not present

## 2019-08-05 DIAGNOSIS — J3081 Allergic rhinitis due to animal (cat) (dog) hair and dander: Secondary | ICD-10-CM | POA: Diagnosis not present

## 2019-08-06 DIAGNOSIS — Z1231 Encounter for screening mammogram for malignant neoplasm of breast: Secondary | ICD-10-CM | POA: Diagnosis not present

## 2019-08-06 LAB — HM MAMMOGRAPHY

## 2019-08-09 NOTE — Telephone Encounter (Signed)
Please call patient. She read Joy's message concerning needing pap results but did not reply. We received copy of her mammogram from [facility].   Patient needs to establish care with new provider

## 2019-08-10 ENCOUNTER — Encounter: Payer: Self-pay | Admitting: Medical-Surgical

## 2019-08-12 DIAGNOSIS — J3081 Allergic rhinitis due to animal (cat) (dog) hair and dander: Secondary | ICD-10-CM | POA: Diagnosis not present

## 2019-08-12 DIAGNOSIS — J301 Allergic rhinitis due to pollen: Secondary | ICD-10-CM | POA: Diagnosis not present

## 2019-08-12 DIAGNOSIS — J3089 Other allergic rhinitis: Secondary | ICD-10-CM | POA: Diagnosis not present

## 2019-08-19 DIAGNOSIS — J3081 Allergic rhinitis due to animal (cat) (dog) hair and dander: Secondary | ICD-10-CM | POA: Diagnosis not present

## 2019-08-19 DIAGNOSIS — J301 Allergic rhinitis due to pollen: Secondary | ICD-10-CM | POA: Diagnosis not present

## 2019-08-19 DIAGNOSIS — J3089 Other allergic rhinitis: Secondary | ICD-10-CM | POA: Diagnosis not present

## 2019-08-26 DIAGNOSIS — J301 Allergic rhinitis due to pollen: Secondary | ICD-10-CM | POA: Diagnosis not present

## 2019-08-26 DIAGNOSIS — J3089 Other allergic rhinitis: Secondary | ICD-10-CM | POA: Diagnosis not present

## 2019-08-26 DIAGNOSIS — J3081 Allergic rhinitis due to animal (cat) (dog) hair and dander: Secondary | ICD-10-CM | POA: Diagnosis not present

## 2019-09-02 DIAGNOSIS — J3081 Allergic rhinitis due to animal (cat) (dog) hair and dander: Secondary | ICD-10-CM | POA: Diagnosis not present

## 2019-09-02 DIAGNOSIS — J301 Allergic rhinitis due to pollen: Secondary | ICD-10-CM | POA: Diagnosis not present

## 2019-09-02 DIAGNOSIS — J3089 Other allergic rhinitis: Secondary | ICD-10-CM | POA: Diagnosis not present

## 2019-09-09 DIAGNOSIS — J3081 Allergic rhinitis due to animal (cat) (dog) hair and dander: Secondary | ICD-10-CM | POA: Diagnosis not present

## 2019-09-09 DIAGNOSIS — J3089 Other allergic rhinitis: Secondary | ICD-10-CM | POA: Diagnosis not present

## 2019-09-09 DIAGNOSIS — J301 Allergic rhinitis due to pollen: Secondary | ICD-10-CM | POA: Diagnosis not present

## 2019-09-16 DIAGNOSIS — J3081 Allergic rhinitis due to animal (cat) (dog) hair and dander: Secondary | ICD-10-CM | POA: Diagnosis not present

## 2019-09-16 DIAGNOSIS — J301 Allergic rhinitis due to pollen: Secondary | ICD-10-CM | POA: Diagnosis not present

## 2019-09-16 DIAGNOSIS — J3089 Other allergic rhinitis: Secondary | ICD-10-CM | POA: Diagnosis not present

## 2019-09-23 DIAGNOSIS — J3089 Other allergic rhinitis: Secondary | ICD-10-CM | POA: Diagnosis not present

## 2019-09-23 DIAGNOSIS — J301 Allergic rhinitis due to pollen: Secondary | ICD-10-CM | POA: Diagnosis not present

## 2019-09-23 DIAGNOSIS — J3081 Allergic rhinitis due to animal (cat) (dog) hair and dander: Secondary | ICD-10-CM | POA: Diagnosis not present

## 2019-09-30 DIAGNOSIS — J301 Allergic rhinitis due to pollen: Secondary | ICD-10-CM | POA: Diagnosis not present

## 2019-09-30 DIAGNOSIS — J3081 Allergic rhinitis due to animal (cat) (dog) hair and dander: Secondary | ICD-10-CM | POA: Diagnosis not present

## 2019-09-30 DIAGNOSIS — J3089 Other allergic rhinitis: Secondary | ICD-10-CM | POA: Diagnosis not present

## 2019-10-04 IMAGING — DX DG STERNUM 2+V
2 series · 2 of 2 positions shown · non-contrast
Comparison: None.

CLINICAL DATA: Two day history of pain and swelling along the
xiphoid process.

EXAM:
STERNUM - 2+ VIEW

[sternum lat]
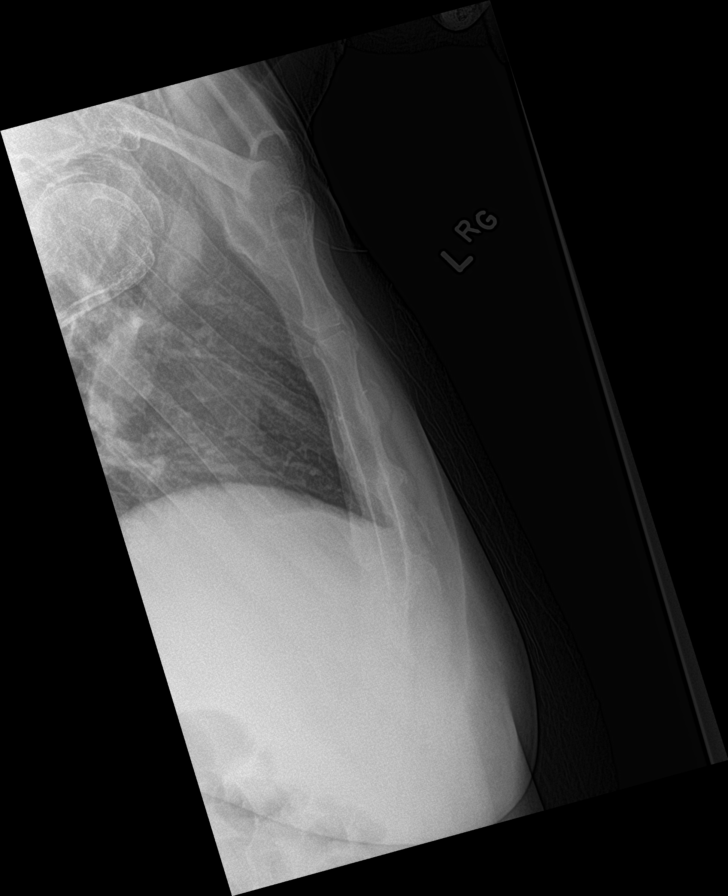

[chest pa]
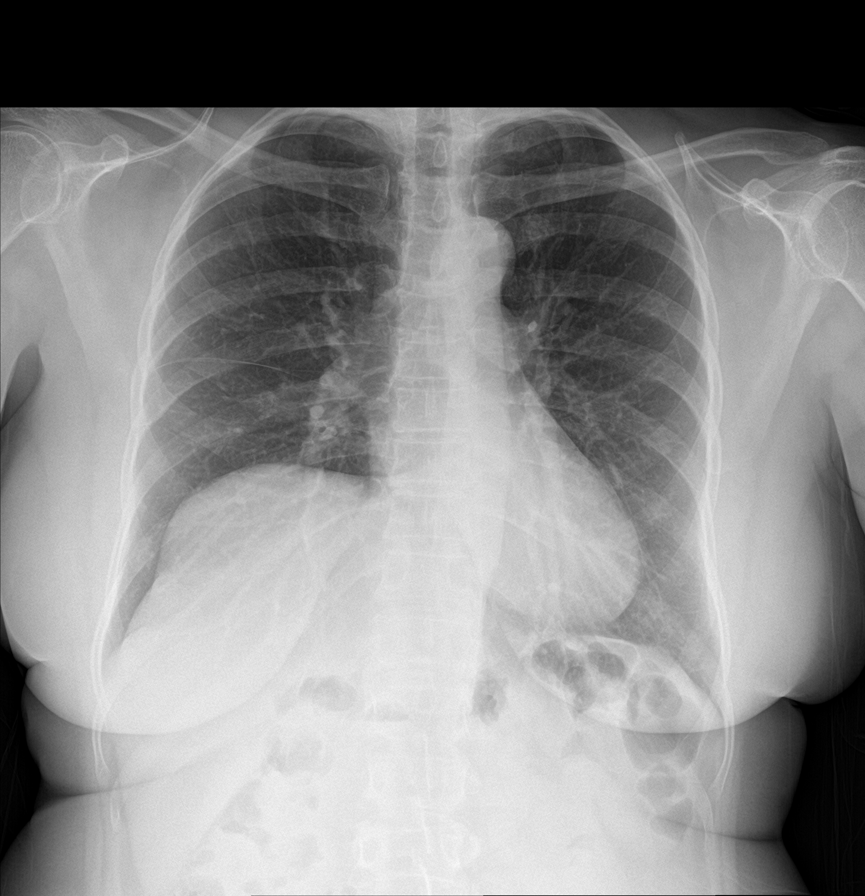

[2 of 2 positions shown; findings below may reference images not displayed]

FINDINGS: The cardiac silhouette, mediastinal and hilar contours are normal.
The lungs are clear. Moderate eventration of the right
hemidiaphragm. The bony thorax is intact. Specifically, I do not see
any evidence of a sternal injury or bone lesion. No substernal soft
tissue swelling.
IMPRESSION: Normal radiographs.

## 2019-10-05 DIAGNOSIS — J3081 Allergic rhinitis due to animal (cat) (dog) hair and dander: Secondary | ICD-10-CM | POA: Diagnosis not present

## 2019-10-05 DIAGNOSIS — J3089 Other allergic rhinitis: Secondary | ICD-10-CM | POA: Diagnosis not present

## 2019-10-05 DIAGNOSIS — J301 Allergic rhinitis due to pollen: Secondary | ICD-10-CM | POA: Diagnosis not present

## 2019-10-12 DIAGNOSIS — J301 Allergic rhinitis due to pollen: Secondary | ICD-10-CM | POA: Diagnosis not present

## 2019-10-12 DIAGNOSIS — J3089 Other allergic rhinitis: Secondary | ICD-10-CM | POA: Diagnosis not present

## 2019-10-12 DIAGNOSIS — J3081 Allergic rhinitis due to animal (cat) (dog) hair and dander: Secondary | ICD-10-CM | POA: Diagnosis not present

## 2019-11-09 DIAGNOSIS — J301 Allergic rhinitis due to pollen: Secondary | ICD-10-CM | POA: Diagnosis not present

## 2019-11-09 DIAGNOSIS — J3081 Allergic rhinitis due to animal (cat) (dog) hair and dander: Secondary | ICD-10-CM | POA: Diagnosis not present

## 2019-11-09 DIAGNOSIS — J3089 Other allergic rhinitis: Secondary | ICD-10-CM | POA: Diagnosis not present

## 2019-11-16 DIAGNOSIS — J3089 Other allergic rhinitis: Secondary | ICD-10-CM | POA: Diagnosis not present

## 2019-11-16 DIAGNOSIS — J3081 Allergic rhinitis due to animal (cat) (dog) hair and dander: Secondary | ICD-10-CM | POA: Diagnosis not present

## 2019-11-16 DIAGNOSIS — J301 Allergic rhinitis due to pollen: Secondary | ICD-10-CM | POA: Diagnosis not present

## 2019-11-25 DIAGNOSIS — J3081 Allergic rhinitis due to animal (cat) (dog) hair and dander: Secondary | ICD-10-CM | POA: Diagnosis not present

## 2019-11-25 DIAGNOSIS — J301 Allergic rhinitis due to pollen: Secondary | ICD-10-CM | POA: Diagnosis not present

## 2019-11-25 DIAGNOSIS — J3089 Other allergic rhinitis: Secondary | ICD-10-CM | POA: Diagnosis not present

## 2019-11-29 DIAGNOSIS — J3089 Other allergic rhinitis: Secondary | ICD-10-CM | POA: Diagnosis not present

## 2019-11-29 DIAGNOSIS — J301 Allergic rhinitis due to pollen: Secondary | ICD-10-CM | POA: Diagnosis not present

## 2019-12-02 DIAGNOSIS — J301 Allergic rhinitis due to pollen: Secondary | ICD-10-CM | POA: Diagnosis not present

## 2019-12-02 DIAGNOSIS — J3081 Allergic rhinitis due to animal (cat) (dog) hair and dander: Secondary | ICD-10-CM | POA: Diagnosis not present

## 2019-12-02 DIAGNOSIS — J3089 Other allergic rhinitis: Secondary | ICD-10-CM | POA: Diagnosis not present

## 2019-12-09 DIAGNOSIS — J3081 Allergic rhinitis due to animal (cat) (dog) hair and dander: Secondary | ICD-10-CM | POA: Diagnosis not present

## 2019-12-09 DIAGNOSIS — J3089 Other allergic rhinitis: Secondary | ICD-10-CM | POA: Diagnosis not present

## 2019-12-09 DIAGNOSIS — J301 Allergic rhinitis due to pollen: Secondary | ICD-10-CM | POA: Diagnosis not present

## 2019-12-16 DIAGNOSIS — J3089 Other allergic rhinitis: Secondary | ICD-10-CM | POA: Diagnosis not present

## 2019-12-16 DIAGNOSIS — J3081 Allergic rhinitis due to animal (cat) (dog) hair and dander: Secondary | ICD-10-CM | POA: Diagnosis not present

## 2019-12-16 DIAGNOSIS — J301 Allergic rhinitis due to pollen: Secondary | ICD-10-CM | POA: Diagnosis not present

## 2019-12-19 DIAGNOSIS — R110 Nausea: Secondary | ICD-10-CM | POA: Diagnosis not present

## 2019-12-19 DIAGNOSIS — M79662 Pain in left lower leg: Secondary | ICD-10-CM | POA: Diagnosis not present

## 2019-12-19 DIAGNOSIS — Y92410 Unspecified street and highway as the place of occurrence of the external cause: Secondary | ICD-10-CM | POA: Diagnosis not present

## 2019-12-19 DIAGNOSIS — S80211A Abrasion, right knee, initial encounter: Secondary | ICD-10-CM | POA: Diagnosis not present

## 2019-12-19 DIAGNOSIS — G4489 Other headache syndrome: Secondary | ICD-10-CM | POA: Diagnosis not present

## 2019-12-19 DIAGNOSIS — M79661 Pain in right lower leg: Secondary | ICD-10-CM | POA: Diagnosis not present

## 2019-12-19 DIAGNOSIS — R519 Headache, unspecified: Secondary | ICD-10-CM | POA: Diagnosis not present

## 2019-12-19 DIAGNOSIS — S80212A Abrasion, left knee, initial encounter: Secondary | ICD-10-CM | POA: Diagnosis not present

## 2019-12-24 DIAGNOSIS — R52 Pain, unspecified: Secondary | ICD-10-CM | POA: Diagnosis not present

## 2019-12-24 DIAGNOSIS — S8990XA Unspecified injury of unspecified lower leg, initial encounter: Secondary | ICD-10-CM | POA: Diagnosis not present

## 2019-12-24 DIAGNOSIS — M79606 Pain in leg, unspecified: Secondary | ICD-10-CM | POA: Diagnosis not present

## 2019-12-27 DIAGNOSIS — Z20828 Contact with and (suspected) exposure to other viral communicable diseases: Secondary | ICD-10-CM | POA: Diagnosis not present

## 2020-01-12 DIAGNOSIS — L720 Epidermal cyst: Secondary | ICD-10-CM | POA: Diagnosis not present

## 2020-01-12 DIAGNOSIS — L209 Atopic dermatitis, unspecified: Secondary | ICD-10-CM | POA: Diagnosis not present

## 2020-01-12 DIAGNOSIS — L700 Acne vulgaris: Secondary | ICD-10-CM | POA: Diagnosis not present

## 2020-01-17 DIAGNOSIS — J3089 Other allergic rhinitis: Secondary | ICD-10-CM | POA: Diagnosis not present

## 2020-01-17 DIAGNOSIS — J3081 Allergic rhinitis due to animal (cat) (dog) hair and dander: Secondary | ICD-10-CM | POA: Diagnosis not present

## 2020-01-17 DIAGNOSIS — J301 Allergic rhinitis due to pollen: Secondary | ICD-10-CM | POA: Diagnosis not present

## 2021-07-18 ENCOUNTER — Other Ambulatory Visit: Payer: Self-pay

## 2021-07-18 ENCOUNTER — Emergency Department
Admission: EM | Admit: 2021-07-18 | Discharge: 2021-07-18 | Disposition: A | Discharge status: Home / Self Care | Payer: BLUE CROSS/BLUE SHIELD | Source: Home / Self Care

## 2021-07-18 ENCOUNTER — Encounter: Payer: Self-pay | Admitting: Emergency Medicine

## 2021-07-18 DIAGNOSIS — J0100 Acute maxillary sinusitis, unspecified: Secondary | ICD-10-CM | POA: Diagnosis not present

## 2021-07-18 DIAGNOSIS — R059 Cough, unspecified: Secondary | ICD-10-CM

## 2021-07-18 DIAGNOSIS — J309 Allergic rhinitis, unspecified: Secondary | ICD-10-CM | POA: Diagnosis not present

## 2021-07-18 DIAGNOSIS — J3489 Other specified disorders of nose and nasal sinuses: Secondary | ICD-10-CM

## 2021-07-18 MED ORDER — PREDNISONE 50 MG PO TABS: ORAL_TABLET | ORAL | 0 refills | Status: DC

## 2021-07-18 MED ORDER — FEXOFENADINE HCL 180 MG PO TABS: 180.0000 mg | ORAL_TABLET | Freq: Every day | ORAL | 0 refills | Status: DC

## 2021-07-18 MED ORDER — BENZONATATE 200 MG PO CAPS: 200.0000 mg | ORAL_CAPSULE | Freq: Three times a day (TID) | ORAL | 0 refills | Status: AC | PRN

## 2021-07-18 MED ORDER — AMOXICILLIN-POT CLAVULANATE 875-125 MG PO TABS: 1.0000 | ORAL_TABLET | Freq: Two times a day (BID) | ORAL | 0 refills | Status: DC

## 2021-07-18 NOTE — ED Triage Notes (Signed)
Sinus congestion & pressure w/ headache  ?since saturday  ?OTC zyrtec  ? Home COVID test - negative on Monday  ?Tmax at 100.6 yesterday - no temp today  ?Works outside   ?

## 2021-07-18 NOTE — ED Provider Notes (Signed)
[name]    CSN: [id number] Arrival date & time: 07/18/21  1652      History   Chief Complaint Chief Complaint  Patient presents with   Facial Pain    HPI Tracey Terrell is a 61 y.o. female.   HPI 61 year old female presents with sinus congestion, sinus pressure with headaches for 5 days.  Patient reports negative home COVID-19 test on Monday and temperature of 100.6 yesterday.  PMH significant for HTN and migraines.  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 Authorizing Provider  amoxicillin-clavulanate (AUGMENTIN) 875-125 MG tablet Take 1 tablet by mouth every 12 (twelve) hours. 07/18/21  Yes Trevor Iha, FNP  benzonatate (TESSALON) 200 MG capsule Take 1 capsule (200 mg total) by mouth 3 (three) times daily as needed for up to 7 days for cough. 07/18/21 07/25/21 Yes Trevor Iha, FNP  fexofenadine [facility] ALLERGY) 180 MG tablet Take 1 tablet (180 mg total) by mouth daily for 15 days. 07/18/21 08/02/21 Yes Trevor Iha, FNP  predniSONE  (DELTASONE) 50 MG tablet Take 1 tab p.o. daily for 5 days. 07/18/21  Yes Trevor Iha, FNP  traZODone (DESYREL) 50 MG tablet TAKE 1 TO 2 TABLETS BY MOUTH ONCE DAILY EVERY DAY AT BEDTIME AS NEEDED FOR SLEEP. TAKE 1 TABLET AT BEDTIME AND IF STILL AWAKE IN 30 MINS CAN TAKE 2ND TABLET FOR TOTAL 100MG  DOSE. MONITOR FOR NEXT DAY SEDATION. 10/22/20  Yes [provider]  EPINEPHrine 0.3 mg/0.3 mL IJ SOAJ injection Inject 0.3 mLs (0.3 mg total) into the muscle once as needed (anaphylaxis/allergic reaction). Patient not taking: Reported on 09/11/2018 04/24/18   Carlis Stable, PA-C  levocetirizine [facility] ALLERGY 24HR) 5 MG tablet Take 1 tablet (5 mg total) by mouth every evening. Patient not taking: Reported on 07/18/2021 04/24/18   Carlis Stable, PA-C  lisinopril (PRINIVIL,ZESTRIL) 20 MG tablet Take 20 mg by mouth daily. Patient not taking: Reported on 07/18/2021    [provider]  Multiple Vitamin (MULTIVITAMIN) LIQD Take 5 mLs by mouth daily.    [provider]  Olopatadine HCl 0.2 % SOLN Apply 1 drop to eye  daily. Patient not taking: Reported on 09/11/2018 04/24/18   Carlis Stable, PA-C    Family History Family History  Problem Relation Age of Onset   Arthritis Mother    Cancer Mother    Heart disease Mother    Hypertension Mother    Diabetes Mother    Arthritis Father    Cancer Father    Heart disease Father    Hypertension Father    Diabetes Father    Hypertension Brother    Diabetes Maternal Grandmother    Hypertension Maternal Grandfather    Arthritis Maternal Grandfather     Social History Social History   Tobacco Use   Smoking status: Former   Smokeless tobacco: Never  Building services engineer Use: Never used  Substance Use Topics   Alcohol use: Yes    Alcohol/week: 1.0 standard drink    Types: 1 Glasses of wine per week   Drug use: Never     Allergies   Naproxen   Review of Systems Review of Systems  HENT:   Positive for congestion, sinus pressure and sinus pain.   All other systems reviewed and are negative.   Physical Exam Triage Vital Signs ED Triage Vitals  Enc Vitals Group     BP      Pulse      Resp      Temp      Temp src      SpO2      Weight      Height      Head Circumference      Peak Flow      Pain Score      Pain Loc      Pain Edu?      Excl. in [place]?    No data found.  Updated Vital Signs BP (!) 144/92 (BP Location: Left Arm) Comment: white coat syndrome per pt   Pulse 62    Temp 98.6 F (37 C) (Oral)    Resp 15    Ht 5\' 2"  (1.575 m)    Wt 143 lb (64.9 kg)    SpO2 99%    BMI 26.16 kg/m      Physical Exam Vitals and nursing note reviewed.  Constitutional:      General: She is not in acute distress.    Appearance: Normal appearance. She is normal weight. She is not ill-appearing.  HENT:     Head: Normocephalic and atraumatic.     Right Ear: Tympanic membrane and external ear normal.     Left Ear: Tympanic membrane and external ear normal.     Ears:     Comments: Moderate eustachian tube dysfunction noted bilaterally    Nose:     Comments: Turbinates are erythematous/edematous    Mouth/Throat:     Mouth: Mucous membranes are moist.     Pharynx: Oropharynx is clear.     Comments: Moderate amount of clear drainage of posterior oropharynx noted Eyes:     Extraocular Movements: Extraocular movements intact.     Conjunctiva/sclera: Conjunctivae normal.     Pupils: Pupils are equal, round, and reactive to light.  Cardiovascular:     Rate and Rhythm: Normal rate and regular rhythm.     Pulses: Normal pulses.     Heart sounds: Normal heart sounds.  Pulmonary:     Effort: Pulmonary effort is normal.     Breath sounds: Normal breath sounds.  Musculoskeletal:     Cervical back: Normal range  of motion and neck supple.  Lymphadenopathy:     Cervical: No cervical adenopathy.  Skin:    General: Skin is warm and dry.  Neurological:     General: No focal deficit  present.     Mental Status: She is alert and oriented to person, place, and time.     UC Treatments / Results  Labs (all labs ordered are listed, but only abnormal results are displayed) Labs Reviewed - No data to display  EKG   Radiology No results found.  Procedures Procedures (including critical care time)  Medications Ordered in UC Medications - No data to display  Initial Impression / Assessment and Plan / UC Course  I have reviewed the triage vital signs and the nursing notes.  Pertinent labs & imaging results that were available during my care of the patient were reviewed by me and considered in my medical decision making (see chart for details).     MDM: 1.  Subacute maxillary sinusitis-Rx'd Augmentin; 2.  Sinus pressure-Rx'd prednisone; 3.  Allergic rhinitis-Rx'd Allegra; 4.  Cough-Rx'd Tessalon Perles. Instructed patient to discontinue Zyrtec and Xyzal.  Advised patient to take medication as directed with food to completion.  Advised patient to take prednisone and Allegra with first dose of Augmentin for the next 5 of 7 days.  May use Allegra as needed afterwards for concurrent postnasal drainage/drip.  Advised patient may use Tessalon Perles daily or as needed for cough.  Encouraged patient to increase daily water intake while taking these medications.  Patient discharged home, hemodynamically stable. Final Clinical Impressions(s) / UC Diagnoses   Final diagnoses:  Subacute maxillary sinusitis  Sinus pressure  Allergic rhinitis, unspecified seasonality, unspecified trigger  Cough, unspecified type     Discharge Instructions      Instructed patient to discontinue Zyrtec and Xyzal.  Advised patient to take medication as directed with food to completion.  Advised patient to take prednisone and Allegra with first dose of Augmentin for the next 5 of 7 days.  May use Allegra as needed afterwards for concurrent postnasal drainage/drip.  Advised patient may use Tessalon  Perles daily or as needed for cough.  Encouraged patient to increase daily water intake while taking these medications.     ED Prescriptions     Medication Sig Dispense Auth. Provider   amoxicillin-clavulanate (AUGMENTIN) 875-125 MG tablet Take 1 tablet by mouth every 12 (twelve) hours. 14 tablet Trevor Iha, FNP   predniSONE (DELTASONE) 50 MG tablet Take 1 tab p.o. daily for 5 days. 5 tablet Trevor Iha, FNP   fexofenadine [facility] ALLERGY) 180 MG tablet Take 1 tablet (180 mg total) by mouth daily for 15 days. 15 tablet Trevor Iha, FNP   benzonatate (TESSALON) 200 MG capsule Take 1 capsule (200 mg total) by mouth 3 (three) times daily as needed for up to 7 days for cough. 40 capsule Trevor Iha, FNP      PDMP not reviewed this encounter.   Trevor Iha, FNP 07/18/21 1744

## 2021-07-18 NOTE — Discharge Instructions (Addendum)
Instructed patient to discontinue Zyrtec and Xyzal.  Advised patient to take medication as directed with food to completion.  Advised patient to take prednisone and Allegra with first dose of Augmentin for the next 5 of 7 days.  May use Allegra as needed afterwards for concurrent postnasal drainage/drip.  Advised patient may use Tessalon Perles daily or as needed for cough.  Encouraged patient to increase daily water intake while taking these medications. ?

## 2022-03-12 ENCOUNTER — Ambulatory Visit
Admission: EM | Admit: 2022-03-12 | Discharge: 2022-03-12 | Disposition: A | Discharge status: Home / Self Care | Payer: BLUE CROSS/BLUE SHIELD | Attending: Family Medicine | Admitting: Family Medicine

## 2022-03-12 DIAGNOSIS — J019 Acute sinusitis, unspecified: Secondary | ICD-10-CM | POA: Diagnosis not present

## 2022-03-12 DIAGNOSIS — B9689 Other specified bacterial agents as the cause of diseases classified elsewhere: Secondary | ICD-10-CM | POA: Diagnosis not present

## 2022-03-12 DIAGNOSIS — J302 Other seasonal allergic rhinitis: Secondary | ICD-10-CM | POA: Diagnosis not present

## 2022-03-12 MED ORDER — METHYLPREDNISOLONE ACETATE 80 MG/ML IJ SUSP
80.0000 mg | Freq: Once | INTRAMUSCULAR | Status: AC
  Administered 2022-03-12: 80 mg via INTRAMUSCULAR

## 2022-03-12 MED ORDER — AMOXICILLIN-POT CLAVULANATE 875-125 MG PO TABS: 1.0000 | ORAL_TABLET | Freq: Two times a day (BID) | ORAL | 0 refills | Status: DC

## 2022-03-12 NOTE — Discharge Instructions (Signed)
A shot of steroid has been given.  This will help reduce the swelling and congestion in her sinuses Take antibiotic 2 times a day for a week Drink lots of water See your doctor if not improved by the end of the week

## 2022-03-12 NOTE — ED Provider Notes (Signed)
[name]    CSN: [id number] Arrival date & time: 03/12/22  1857      History   Chief Complaint Chief Complaint  Patient presents with   Nasal Congestion    Nasal congestion, cough, and scratchy throat. X2 weeks    HPI Tracey Terrell is a 61 y.o. female.   HPI  Patient's been sick for 2 weeks.  Sinus congestion postnasal drip sore throat cough and fatigue.  She states that the mucus has turned quite thick and yellow.  It is unpleasant.  She has pressure and congestion across her face and nasal passages. Patient states last time she felt this bad she got a shot and felt much better.  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 Authorizing Provider  amoxicillin-clavulanate (AUGMENTIN) 875-125 MG tablet Take 1 tablet by mouth every 12 (twelve) hours. 03/12/22  Yes Eustace Moore, MD  fexofenadine [facility] ALLERGY) 180 MG tablet Take 1 tablet (180 mg total) by  mouth daily for 15 days. 07/18/21 03/12/22 Yes Trevor Iha, FNP  Multiple Vitamin (MULTIVITAMIN) LIQD Take 5 mLs by mouth daily.   Yes [provider]  traZODone (DESYREL) 50 MG tablet TAKE 1 TO 2 TABLETS BY MOUTH ONCE DAILY EVERY DAY AT BEDTIME AS NEEDED FOR SLEEP. TAKE 1 TABLET AT BEDTIME AND IF STILL AWAKE IN 30 MINS CAN TAKE 2ND TABLET FOR TOTAL 100MG  DOSE. MONITOR FOR NEXT DAY SEDATION. 10/22/20  Yes [provider]    Family History Family History  Problem Relation Age of Onset   Arthritis Mother    Cancer Mother    Heart disease Mother    Hypertension Mother    Diabetes Mother    Arthritis Father    Cancer Father    Heart disease Father    Hypertension Father    Diabetes Father    Hypertension Brother    Diabetes Maternal Grandmother    Hypertension Maternal Grandfather    Arthritis Maternal Grandfather     Social History Social History   Tobacco Use  Smoking status: Former   Smokeless tobacco: Never  Vaping Use   Vaping Use: Never used  Substance Use Topics   Alcohol use: Yes    Alcohol/week: 1.0 standard drink of alcohol    Types: 1 Glasses of wine per week   Drug use: Never     Allergies   Naproxen   Review of Systems Review of Systems See HPI  Physical Exam Triage Vital Signs ED Triage Vitals  Enc Vitals Group     BP 03/12/22 1918 (!) 163/91     Pulse Rate 03/12/22 1918 (!) 107     Resp 03/12/22 1918 18     Temp 03/12/22 1918 98.6 F (37 C)     Temp Source 03/12/22 1918 Oral     SpO2 03/12/22 1918 99 %     Weight 03/12/22 1916 146 lb (66.2 kg)     Height 03/12/22 1916 5' 1.5" (1.562 m)     Head Circumference --      Peak Flow --      Pain Score 03/12/22 1916 0     Pain Loc --      Pain Edu? --      Excl. in [place]? --    No data found.  Updated Vital Signs BP (!) 163/91 (BP Location: Left Arm)   Pulse (!) 107   Temp 98.6 F (37 C) (Oral)   Resp 18   Ht 5' 1.5" (1.562 m)   Wt 66.2 kg   SpO2 99%   BMI 27.14 kg/m       Physical Exam Constitutional:      General: She is not in acute distress.    Appearance: She is well-developed and normal weight. She is ill-appearing.  HENT:     Head: Normocephalic and atraumatic.     Right Ear: Tympanic membrane and ear canal normal.     Left Ear: Tympanic membrane and ear canal normal.     Nose: Congestion and rhinorrhea present.     Mouth/Throat:     Pharynx: Posterior oropharyngeal erythema present.  Eyes:     Conjunctiva/sclera: Conjunctivae normal.     Pupils: Pupils are equal, round, and reactive to light.  Cardiovascular:     Rate and Rhythm: Normal rate and regular rhythm.     Heart sounds: Normal heart sounds.  Pulmonary:     Effort: Pulmonary effort is normal. No respiratory distress.     Breath sounds: Normal breath sounds.  Abdominal:     General: There is no distension.     Palpations: Abdomen is soft.  Musculoskeletal:        General: Normal range of motion.     Cervical back: Normal range of motion.  Lymphadenopathy:     Cervical: Cervical adenopathy present.  Skin:    General: Skin is warm and dry.  Neurological:     Mental Status: She is alert.      UC Treatments / Results  Labs (all labs ordered are listed, but only abnormal results are displayed) Labs Reviewed - No data to display  EKG   Radiology No results found.  Procedures Procedures (including critical care time)  Medications Ordered in UC Medications  methylPREDNISolone acetate (DEPO-MEDROL) injection 80 mg (has no administration in time range)    Initial Impression / Assessment and Plan / UC Course  I have reviewed the triage vital signs and the nursing notes.  Pertinent labs & imaging results that were available during my care of the patient were  reviewed by me and considered in my medical decision making (see chart for details).     Patient is given an injection of Depo-Medrol to help with the allergy symptoms of congestion She is also given  Augmentin for infection Follow-up with PCP Final Clinical Impressions(s) / UC Diagnoses   Final diagnoses:  Seasonal allergies  Acute bacterial sinusitis     Discharge Instructions      A shot of steroid has been given.  This will help reduce the swelling and congestion in her sinuses Take antibiotic 2 times a day for a week Drink lots of water See your doctor if not improved by the end of the week   ED Prescriptions     Medication Sig Dispense Auth. Provider   amoxicillin-clavulanate (AUGMENTIN) 875-125 MG tablet Take 1 tablet by mouth every 12 (twelve) hours. 14 tablet Eustace Moore, MD      PDMP not reviewed this encounter.   Eustace Moore, MD 03/12/22 [phone]

## 2022-03-12 NOTE — ED Triage Notes (Signed)
Pt states that she has some nasal congestion, coughing, and scratchy throat. X2 weeks

## 2022-05-23 ENCOUNTER — Ambulatory Visit
Admission: RE | Admit: 2022-05-23 | Discharge: 2022-05-23 | Disposition: A | Discharge status: Home / Self Care | Payer: BLUE CROSS/BLUE SHIELD | Source: Ambulatory Visit | Attending: Family Medicine | Admitting: Family Medicine

## 2022-05-23 VITALS — BP 128/86 | HR 92 | Temp 99.8°F | Resp 18 | Ht 62.0 in | Wt 143.0 lb

## 2022-05-23 DIAGNOSIS — R509 Fever, unspecified: Secondary | ICD-10-CM

## 2022-05-23 DIAGNOSIS — R059 Cough, unspecified: Secondary | ICD-10-CM

## 2022-05-23 LAB — POCT INFLUENZA A/B
Influenza A, POC: NEGATIVE
Influenza B, POC: NEGATIVE

## 2022-05-23 MED ORDER — PREDNISONE 50 MG PO TABS: ORAL_TABLET | ORAL | 0 refills | Status: DC

## 2022-05-23 MED ORDER — BENZONATATE 200 MG PO CAPS: 200.0000 mg | ORAL_CAPSULE | Freq: Three times a day (TID) | ORAL | 0 refills | Status: AC | PRN

## 2022-05-23 NOTE — ED Provider Notes (Signed)
[facility]    CSN: [id number] Arrival date & time: 05/23/22  1806      History   Chief Complaint Chief Complaint  Patient presents with   Fever    I have a fever 99.6 and chills. I have a slight cough. - Entered by patient    HPI Tracey Terrell is a 62 y.o. female.   HPI 62 year old female presents with low-grade fever of 99.6 and chills with a slight cough for 2 days.  PMH significant for GERD, HTN, and migraines.  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 Authorizing Provider  benzonatate (TESSALON) 200 MG capsule Take 1 capsule (200 mg total) by mouth 3 (three) times daily as needed for up to 7 days. 05/23/22 05/30/22 Yes Eliezer Lofts, FNP  Multiple Vitamin (MULTIVITAMIN) LIQD Take 5 mLs by mouth daily.   Yes [provider]  predniSONE (DELTASONE) 50 MG tablet Take 1 tab p.o. daily for 5 days. 05/23/22  Yes Eliezer Lofts, FNP  traZODone (DESYREL) 50 MG tablet TAKE 1 TO 2 TABLETS BY MOUTH ONCE DAILY EVERY DAY AT BEDTIME AS NEEDED  FOR SLEEP. TAKE 1 TABLET AT BEDTIME AND IF STILL AWAKE IN 30 MINS CAN TAKE 2ND TABLET FOR TOTAL 100MG  DOSE. MONITOR FOR NEXT DAY SEDATION. 10/22/20  Yes [provider]    Family History Family History  Problem Relation Age of Onset   Arthritis Mother    Cancer Mother    Heart disease Mother    Hypertension Mother    Diabetes Mother    Arthritis Father    Cancer Father    Heart disease Father    Hypertension Father    Diabetes Father    Hypertension Brother    Diabetes Maternal Grandmother    Hypertension Maternal Grandfather    Arthritis Maternal Grandfather     Social History Social History   Tobacco Use   Smoking status: Former   Smokeless tobacco: Never  Scientific laboratory technician Use: Never used  Substance Use Topics   Alcohol use: Not Currently    Alcohol/week: 1.0 standard drink of alcohol  Types: 1 Glasses of wine per week   Drug use: Never     Allergies   Naproxen   Review of Systems Review of Systems  Constitutional:  Positive for fever.  Respiratory:  Positive for cough.   All other systems reviewed and are negative.    Physical Exam Triage Vital Signs ED Triage Vitals  Enc Vitals Group     BP 05/23/22 1857 128/86     Pulse Rate 05/23/22 1857 92     Resp 05/23/22 1857 18     Temp 05/23/22 1857 99.8 F (37.7 C)     Temp Source 05/23/22 1857 Oral     SpO2 05/23/22 1857 98 %     Weight 05/23/22 1855 143 lb (64.9 kg)     Height 05/23/22 1855 5\' 2"  (1.575 m)     Head Circumference --      Peak Flow --      Pain Score 05/23/22 1855 7     Pain Loc --      Pain Edu? --      Excl. in [place]? --    No data found.  Updated Vital Signs BP 128/86 (BP Location: Left Arm)   Pulse 92   Temp 99.8 F (37.7 C) (Oral)   Resp 18   Ht 5\' 2"  (1.575 m)   Wt 143 lb (64.9 kg)   SpO2 98%   BMI 26.16 kg/m   Visual Acuity Right Eye Distance:   Left Eye Distance:   Bilateral Distance:    Right Eye Near:   Left Eye Near:    Bilateral Near:      Physical Exam Vitals and nursing note reviewed.  Constitutional:      General: She is not in acute distress.    Appearance: Normal appearance. She is normal weight. She is not ill-appearing.  HENT:     Head: Normocephalic and atraumatic.     Right Ear: Tympanic membrane, ear canal and external ear normal.     Left Ear: Tympanic membrane, ear canal and external ear normal.     Mouth/Throat:     Mouth: Mucous membranes are moist.     Pharynx: Oropharynx is clear.  Eyes:     Extraocular Movements: Extraocular movements intact.     Conjunctiva/sclera: Conjunctivae normal.     Pupils: Pupils are equal, round, and reactive to light.  Cardiovascular:     Rate and Rhythm: Normal rate and regular rhythm.     Pulses: Normal pulses.     Heart sounds: Normal heart sounds.  Pulmonary:     Effort: Pulmonary effort is normal.     Breath sounds: Normal breath sounds. No wheezing, rhonchi or rales.  Musculoskeletal:        General: Normal range of motion.     Cervical back: Normal range of motion and neck supple.  Skin:    General: Skin is warm and dry.  Neurological:     General: No focal deficit present.     Mental Status: She is alert and oriented to person, place, and time.      UC Treatments / Results  Labs (all labs ordered are listed, but only abnormal results are displayed) Labs Reviewed  POCT INFLUENZA A/B    EKG   Radiology No results found.  Procedures Procedures (including critical care time)  Medications Ordered in UC Medications - No data to display  Initial Impression / Assessment and Plan / UC Course  I have reviewed the triage vital  signs and the nursing notes.  Pertinent labs & imaging results that were available during my care of the patient were reviewed by me and considered in my medical decision making (see chart for details).     MDM: 1. Cough-Rx'd prednisone, Tessalon; 2. Fever-advised OTC Tylenol. Advised patient patient as directed with food  to completion.  Advised patient may take OTC Tylenol 1000 mg every 6 hours for fever and myalgias.  Encouraged patient to increase daily water intake to 64 ounces per day while taking this medication.  Advised patient if symptoms worsen and/or unresolved please follow-up with PCP or here for further evaluation.  Patient discharged home, hemodynamically stable. Final Clinical Impressions(s) / UC Diagnoses   Final diagnoses:  Cough, unspecified type  Fever, unspecified     Discharge Instructions      Advised patient patient as directed with food to completion.  Advised patient may take OTC Tylenol 1000 mg every 6 hours for fever and myalgias.  Encouraged patient to increase daily water intake to 64 ounces per day while taking this medication.  Advised patient if symptoms worsen and/or unresolved please follow-up with PCP or here for further evaluation.     ED Prescriptions     Medication Sig Dispense Auth. Provider   predniSONE (DELTASONE) 50 MG tablet Take 1 tab p.o. daily for 5 days. 5 tablet Eliezer Lofts, FNP   benzonatate (TESSALON) 200 MG capsule Take 1 capsule (200 mg total) by mouth 3 (three) times daily as needed for up to 7 days. 40 capsule Eliezer Lofts, FNP      PDMP not reviewed this encounter.   Eliezer Lofts, Andrew 05/23/22 2015

## 2022-05-23 NOTE — ED Triage Notes (Signed)
Pt states that she has a fever, chills, body aches and cough. X2 days

## 2022-05-23 NOTE — Discharge Instructions (Addendum)
Advised patient patient as directed with food to completion.  Advised patient may take OTC Tylenol 1000 mg every 6 hours for fever and myalgias.  Encouraged patient to increase daily water intake to 64 ounces per day while taking this medication.  Advised patient if symptoms worsen and/or unresolved please follow-up with PCP or here for further evaluation.

## 2023-01-14 ENCOUNTER — Encounter: Payer: Self-pay | Admitting: Emergency Medicine

## 2023-01-14 ENCOUNTER — Ambulatory Visit
Admission: EM | Admit: 2023-01-14 | Discharge: 2023-01-14 | Disposition: A | Discharge status: Home / Self Care | Payer: Medicaid Other | Attending: Family Medicine | Admitting: Family Medicine

## 2023-01-14 DIAGNOSIS — R112 Nausea with vomiting, unspecified: Secondary | ICD-10-CM

## 2023-01-14 MED ORDER — ONDANSETRON 8 MG PO TBDP
8.0000 mg | ORAL_TABLET | Freq: Once | ORAL | Status: AC
  Administered 2023-01-14: 8 mg via ORAL

## 2023-01-14 MED ORDER — ONDANSETRON 8 MG PO TBDP: 8.0000 mg | ORAL_TABLET | Freq: Three times a day (TID) | ORAL | 0 refills | Status: DC | PRN

## 2023-01-14 NOTE — ED Triage Notes (Signed)
Patient c/o vomiting x 2 days, soft stools, afebrile.  Unsure if she ate something bad or not.  Patient has taken Pepto Bismol.

## 2023-01-14 NOTE — Discharge Instructions (Addendum)
Advised patient may take Zofran daily or as needed for nausea.  Advised patient adhere to BRAT (bananas, rice/rice pudding, applesauce, and toast) diet for the next 2 to 3 days gradually returning to normal diet.  Encouraged to increase daily water intake to 64 ounces per day when taking this medication.  Advised if symptoms worsen and/or unresolved follow-up PCP or here for further evaluation.

## 2023-01-14 NOTE — ED Provider Notes (Signed)
[facility]    CSN: [id number] Arrival date & time: 01/14/23  1858      History   Chief Complaint Chief Complaint  Patient presents with   Emesis    HPI Tracey Terrell is a 62 y.o. female.   HPI 62 year old female presents with vomiting for 2 days with soft 2 stools.  Patient is unsure if she ate something contaminated or not.  PMH significant for HTN, migraines, and GERD.  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 Authorizing Provider  Multiple Vitamin (MULTIVITAMIN) LIQD Take 5 mLs by mouth daily.   Yes [provider]  traZODone (DESYREL) 50 MG tablet TAKE 1 TO 2 TABLETS BY MOUTH ONCE DAILY EVERY DAY AT BEDTIME AS NEEDED FOR SLEEP. TAKE 1 TABLET AT BEDTIME AND IF STILL AWAKE IN 30 MINS CAN TAKE 2ND TABLET FOR TOTAL 100MG  DOSE. MONITOR FOR NEXT DAY SEDATION. 10/22/20  Yes [provider]  ondansetron (ZOFRAN-ODT) 8 MG disintegrating tablet Take 1 tablet (8 mg total) by mouth every 8 (eight) hours as needed for nausea or  vomiting. 01/14/23  Yes Trevor Iha, FNP  predniSONE (DELTASONE) 50 MG tablet Take 1 tab p.o. daily for 5 days. 05/23/22   Trevor Iha, FNP    Family History Family History  Problem Relation Age of Onset   Arthritis Mother    Cancer Mother    Heart disease Mother    Hypertension Mother    Diabetes Mother    Arthritis Father    Cancer Father    Heart disease Father    Hypertension Father    Diabetes Father    Hypertension Brother    Diabetes Maternal Grandmother    Hypertension Maternal Grandfather    Arthritis Maternal Grandfather     Social History Social History   Tobacco Use   Smoking status: Former   Smokeless tobacco: Never  Advertising account planner   Vaping status: Never Used  Substance Use Topics   Alcohol use: Not Currently    Alcohol/week: 1.0 standard drink of alcohol    Types: 1 Glasses of wine per week   Drug use: Never  Allergies   Naproxen   Review of Systems Review of Systems  Gastrointestinal:  Positive for nausea and vomiting.  All other systems reviewed and are negative.    Physical Exam Triage Vital Signs ED Triage Vitals  Encounter Vitals Group     BP 01/14/23 1922 (!) 158/97     Systolic BP Percentile --      Diastolic BP Percentile --      Pulse Rate 01/14/23 1922 (!) 59     Resp 01/14/23 1922 16     Temp 01/14/23 1922 98.4 F (36.9 C)     Temp Source 01/14/23 1922 Oral     SpO2 01/14/23 1922 98 %     Weight 01/14/23 1923 133 lb (60.3 kg)     Height 01/14/23 1923 5\' 2"  (1.575 m)     Head Circumference --      Peak Flow --      Pain Score 01/14/23 1923 0     Pain Loc --      Pain Education --      Exclude from Growth Chart --    No data found.  Updated Vital Signs BP (!) 158/97 (BP Location: Right Arm)   Pulse (!) 59   Temp 98.4 F (36.9 C) (Oral)   Resp 16   Ht 5\' 2"  (1.575 m)   Wt 133 lb (60.3 kg)   SpO2 98%   BMI 24.33 kg/m    Physical Exam Vitals and nursing note reviewed.  Constitutional:      General: She is  not in acute distress.    Appearance: Normal appearance. She is normal weight. She is not ill-appearing.  HENT:     Head: Normocephalic and atraumatic.     Right Ear: Tympanic membrane, ear canal and external ear normal.     Left Ear: Tympanic membrane, ear canal and external ear normal.     Mouth/Throat:     Mouth: Mucous membranes are moist.     Pharynx: Oropharynx is clear.  Eyes:     Extraocular Movements: Extraocular movements intact.     Conjunctiva/sclera: Conjunctivae normal.     Pupils: Pupils are equal, round, and reactive to light.  Cardiovascular:     Rate and Rhythm: Normal rate and regular rhythm.     Pulses: Normal pulses.     Heart sounds: Normal heart sounds.  Pulmonary:     Effort: Pulmonary effort is normal.     Breath sounds: Normal breath sounds. No wheezing, rhonchi or rales.  Musculoskeletal:        General: Normal range of motion.     Cervical back: Normal range of motion and neck supple.  Skin:    General: Skin is warm and dry.  Neurological:     General: No focal deficit present.     Mental Status: She is alert and oriented to person, place, and time. Mental status is at baseline.  Psychiatric:        Mood and Affect: Mood normal.        Behavior: Behavior normal.        Thought Content: Thought content normal.      UC Treatments / Results  Labs (all labs ordered are listed, but only abnormal results are displayed) Labs Reviewed - No data to display  EKG   Radiology No results found.  Procedures Procedures (including critical care time)  Medications Ordered in UC Medications  ondansetron (ZOFRAN-ODT) disintegrating tablet 8 mg (8 mg Oral Given 01/14/23 1954)  Initial Impression / Assessment and Plan / UC Course  I have reviewed the triage vital signs and the nursing notes.  Pertinent labs & imaging results that were available during my care of the patient were reviewed by me and considered in my medical decision making (see chart  for details).     MDM: 1.  Nausea and vomiting, unspecified vomiting type-Zofran 8 mg given once in clinic and prior to discharge, Rx'd Zofran 8 mg disintegrating tablet: Take 1 tablet by mouth every 8 hours, as needed for nausea. Advised patient may take Zofran daily or as needed for nausea.  Advised patient adhere to BRAT (bananas, rice/rice pudding, applesauce, and toast) diet for the next 2 to 3 days gradually returning to normal diet.  Encouraged to increase daily water intake to 64 ounces per day when taking this medication.  Advised if symptoms worsen and/or unresolved follow-up PCP or here for further evaluation.  Patient discharged home, hemodynamically stable.  Patient discharged home, hemodynamically stable. Final Clinical Impressions(s) / UC Diagnoses   Final diagnoses:  Nausea and vomiting, unspecified vomiting type     Discharge Instructions      Advised patient may take Zofran daily or as needed for nausea.  Advised patient adhere to BRAT (bananas, rice/rice pudding, applesauce, and toast) diet for the next 2 to 3 days gradually returning to normal diet.  Encouraged to increase daily water intake to 64 ounces per day when taking this medication.  Advised if symptoms worsen and/or unresolved follow-up PCP or here for further evaluation.     ED Prescriptions     Medication Sig Dispense Auth. Provider   ondansetron (ZOFRAN-ODT) 8 MG disintegrating tablet Take 1 tablet (8 mg total) by mouth every 8 (eight) hours as needed for nausea or vomiting. 24 tablet Trevor Iha, FNP      PDMP not reviewed this encounter.   Trevor Iha, FNP 01/14/23 2001

## 2024-06-09 ENCOUNTER — Encounter: Payer: Self-pay | Admitting: Emergency Medicine

## 2024-06-09 ENCOUNTER — Ambulatory Visit
Admission: EM | Admit: 2024-06-09 | Discharge: 2024-06-09 | Disposition: A | Discharge status: Home / Self Care | Attending: Family Medicine | Admitting: Family Medicine

## 2024-06-09 DIAGNOSIS — J111 Influenza due to unidentified influenza virus with other respiratory manifestations: Secondary | ICD-10-CM

## 2024-06-09 LAB — POC SOFIA SARS ANTIGEN FIA: SARS Coronavirus 2 Ag: NEGATIVE

## 2024-06-09 LAB — POCT INFLUENZA A/B
Influenza A, POC: NEGATIVE
Influenza B, POC: NEGATIVE

## 2024-06-09 NOTE — Discharge Instructions (Signed)
 Advised patient test (COVID-19 and influenza) were negative today.  Advised if symptoms worsen and are unresolved please follow-up with your PCP or here for further evaluation.

## 2024-06-09 NOTE — ED Provider Notes (Signed)
 " Tracey Terrell    CSN: [id number] Arrival date & time: 06/09/24  1128      History   Chief Complaint Chief Complaint  Patient presents with   Cough    HPI Tracey Terrell is a 64 y.o. female.   HPI Pleasant 64 year old female presents with cough, runny nose, and fatigue for 2 days.  PMH significant for HTN, migraines, and frequent UTI.  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 Authorizing Provider  ISOtretinoin (ACCUTANE) 40 MG capsule Take 40 mg by mouth 2 (two) times daily.   Yes [provider]  Multiple Vitamin (MULTIVITAMIN) LIQD Take 5 mLs by mouth daily.    [provider]    Family History Family History  Problem Relation Age of Onset   Arthritis Mother    Cancer Mother    Heart disease Mother    Hypertension Mother    Diabetes Mother    Arthritis Father    Cancer Father    Heart disease Father    Hypertension Father    Diabetes Father    Hypertension Brother    Diabetes Maternal Grandmother     Hypertension Maternal Grandfather    Arthritis Maternal Grandfather     Social History Social History[1]   Allergies   Naproxen   Review of Systems Review of Systems  Constitutional:  Positive for fatigue.  HENT:  Positive for congestion.   Respiratory:  Positive for cough.   All other systems reviewed and are negative.    Physical Exam Triage Vital Signs ED Triage Vitals  Encounter Vitals Group     BP      Girls Systolic BP Percentile      Girls Diastolic BP Percentile      Boys Systolic BP Percentile      Boys Diastolic BP Percentile      Pulse      Resp      Temp      Temp src      SpO2      Weight      Height      Head Circumference  Peak Flow      Pain Score      Pain Loc      Pain Education      Exclude from Growth Chart    No data found.  Updated Vital Signs BP (!) 161/111 (BP Location: Left Arm)   Pulse 76   Temp 98.8 F (37.1 C) (Oral)   Resp 18   Ht 5' 2 (1.575 m)   Wt 155 lb (70.3 kg)   SpO2 96%   BMI 28.35 kg/m    Physical Exam Vitals and nursing note reviewed.  Constitutional:      Appearance: Normal appearance. She is normal weight.  HENT:     Head: Normocephalic and atraumatic.     Right Ear: Tympanic membrane, ear canal and external ear normal.     Left Ear: Tympanic membrane, ear canal and external ear normal.     Mouth/Throat:     Mouth: Mucous membranes are moist.     Pharynx: Oropharynx is clear.  Eyes:     Extraocular Movements: Extraocular movements intact.     Conjunctiva/sclera: Conjunctivae normal.     Pupils: Pupils are equal, round, and reactive to light.  Cardiovascular:     Rate and Rhythm: Normal rate and regular rhythm.     Heart sounds: Normal heart sounds.  Pulmonary:     Effort: Pulmonary effort is normal.     Breath sounds: Normal breath sounds. No wheezing, rhonchi or rales.  Musculoskeletal:        General: Normal range of motion.  Skin:    General: Skin is warm and dry.  Neurological:      General: No focal deficit present.     Mental Status: She is alert and oriented to person, place, and time.  Psychiatric:        Mood and Affect: Mood normal.        Behavior: Behavior normal.      UC Treatments / Results  Labs (all labs ordered are listed, but only abnormal results are displayed) Labs Reviewed  POC SOFIA SARS ANTIGEN FIA - Normal  POCT INFLUENZA A/B - Normal    EKG   Radiology No results found.  Procedures Procedures (including critical Terrell time)  Medications Ordered in UC Medications - No data to display  Initial Impression / Assessment and Plan / UC Course  I have reviewed the triage vital signs and the nursing notes.  Pertinent labs & imaging results that were available during my Terrell of the patient were reviewed by me and considered in my medical decision making (see chart for details).     DM: 1.  Influenza-like illness-advised patient COVID-19 and influenza A/B were both negative today. Advised patient test (COVID-19 and influenza) were negative today.  Advised if symptoms worsen and are unresolved please follow-up with your PCP or here for further evaluation.  Patient discharged home, hemodynamically stable. Final Clinical Impressions(s) / UC Diagnoses   Final diagnoses:  Influenza-like illness     Discharge Instructions      Advised patient test (COVID-19 and influenza) were negative today.  Advised if symptoms worsen and are unresolved please follow-up with your PCP or here for further evaluation.     ED Prescriptions   None    PDMP not reviewed this encounter.    [1]  Social History Tobacco Use   Smoking status: Former   Smokeless tobacco: Never  Vaping Use   Vaping status: Never Used  Substance Use Topics   Alcohol   use: Not Currently    Alcohol /week: 1.0 standard drink of alcohol     Types: 1 Glasses of wine per week   Drug use: Never     Teddy Sharper, FNP 06/09/24 1227  "

## 2024-06-09 NOTE — ED Triage Notes (Signed)
 Pt c/o a runny nose, slight nonproductive cough and fatigue  Onset 2 days ago
